# Patient Record
Sex: Male | Born: 1980 | Race: Black or African American | Hispanic: No | Marital: Single | Smoking: Former smoker
Health system: Southern US, Community
[De-identification: ages and names within clinical notes are randomized; demographics above are authoritative.]

## PROBLEM LIST (undated history)

## (undated) DIAGNOSIS — I10 Essential (primary) hypertension: Secondary | ICD-10-CM

## (undated) DIAGNOSIS — E669 Obesity, unspecified: Secondary | ICD-10-CM

## (undated) DIAGNOSIS — M109 Gout, unspecified: Secondary | ICD-10-CM

## (undated) HISTORY — DX: Essential (primary) hypertension: I10

## (undated) HISTORY — DX: Gout, unspecified: M10.9

---

## 2001-01-05 ENCOUNTER — Encounter: Payer: Self-pay | Admitting: Emergency Medicine

## 2001-01-05 ENCOUNTER — Emergency Department (HOSPITAL_COMMUNITY): Admission: EM | Admit: 2001-01-05 | Discharge: 2001-01-06 | Payer: Self-pay | Admitting: Emergency Medicine

## 2007-04-25 ENCOUNTER — Emergency Department (HOSPITAL_COMMUNITY): Admission: EM | Admit: 2007-04-25 | Discharge: 2007-04-25 | Payer: Self-pay | Admitting: Emergency Medicine

## 2013-12-30 ENCOUNTER — Encounter (HOSPITAL_COMMUNITY): Payer: Self-pay | Admitting: Emergency Medicine

## 2013-12-30 ENCOUNTER — Emergency Department (HOSPITAL_COMMUNITY)
Admission: EM | Admit: 2013-12-30 | Discharge: 2013-12-31 | Disposition: A | Discharge status: Home / Self Care | Payer: Self-pay | Attending: Emergency Medicine | Admitting: Emergency Medicine

## 2013-12-30 DIAGNOSIS — I10 Essential (primary) hypertension: Secondary | ICD-10-CM | POA: Insufficient documentation

## 2013-12-30 DIAGNOSIS — F17200 Nicotine dependence, unspecified, uncomplicated: Secondary | ICD-10-CM | POA: Insufficient documentation

## 2013-12-30 DIAGNOSIS — E669 Obesity, unspecified: Secondary | ICD-10-CM | POA: Insufficient documentation

## 2013-12-30 DIAGNOSIS — M7989 Other specified soft tissue disorders: Secondary | ICD-10-CM | POA: Insufficient documentation

## 2013-12-30 DIAGNOSIS — I5021 Acute systolic (congestive) heart failure: Secondary | ICD-10-CM | POA: Insufficient documentation

## 2013-12-30 HISTORY — DX: Obesity, unspecified: E66.9

## 2013-12-30 LAB — PRO B NATRIURETIC PEPTIDE: Pro B Natriuretic peptide (BNP): 2182 pg/mL — ABNORMAL HIGH (ref 0–125)

## 2013-12-30 LAB — COMPREHENSIVE METABOLIC PANEL
ALT: 24 U/L (ref 0–53)
AST: 33 U/L (ref 0–37)
Albumin: 3.6 g/dL (ref 3.5–5.2)
Alkaline Phosphatase: 62 U/L (ref 39–117)
Anion gap: 18 — ABNORMAL HIGH (ref 5–15)
BUN: 12 mg/dL (ref 6–23)
CO2: 20 mEq/L (ref 19–32)
Calcium: 8.9 mg/dL (ref 8.4–10.5)
Chloride: 99 mEq/L (ref 96–112)
Creatinine, Ser: 1.35 mg/dL (ref 0.50–1.35)
GFR calc Af Amer: 80 mL/min — ABNORMAL LOW (ref >90)
GFR calc non Af Amer: 69 mL/min — ABNORMAL LOW (ref >90)
Glucose, Bld: 94 mg/dL (ref 70–99)
Potassium: 4.3 mEq/L (ref 3.7–5.3)
Sodium: 137 mEq/L (ref 137–147)
Total Bilirubin: 0.8 mg/dL (ref 0.3–1.2)
Total Protein: 7 g/dL (ref 6.0–8.3)

## 2013-12-30 LAB — CBC
HCT: 38.5 % — ABNORMAL LOW (ref 39.0–52.0)
Hemoglobin: 13.6 g/dL (ref 13.0–17.0)
MCH: 29.2 pg (ref 26.0–34.0)
MCHC: 35.3 g/dL (ref 30.0–36.0)
MCV: 82.6 fL (ref 78.0–100.0)
Platelets: 292 10*3/uL (ref 150–400)
RBC: 4.66 MIL/uL (ref 4.22–5.81)
RDW: 14.9 % (ref 11.5–15.5)
WBC: 14.3 10*3/uL — ABNORMAL HIGH (ref 4.0–10.5)

## 2013-12-30 NOTE — ED Provider Notes (Signed)
CSN: [id number]     Arrival date & time 12/30/13  1921 History   First MD Initiated Contact with Patient 12/30/13 2148     Chief Complaint  Patient presents with  . Insect Bite  . Leg Swelling     (Consider location/radiation/quality/duration/timing/severity/associated sxs/prior Treatment) Patient is a 33 y.o. male presenting with general illness. The history is provided by the patient.  Illness Location:  Lower leg swelling Quality:  Swelling Severity:  Mild Onset quality:  Gradual Duration:  3 days Timing:  Constant Progression:  Worsening Chronicity:  New Context:  HTN, not on meds - multiple flea bites, thought it was swelling due to flights Relieved by:  Nothing Worsened by:  Nothing Associated symptoms: no abdominal pain, no cough, no fever, no shortness of breath and no vomiting     Past Medical History  Diagnosis Date  . Obese    History reviewed. No pertinent past surgical history. No family history on file. History  Substance Use Topics  . Smoking status: Current Every Day Smoker  . Smokeless tobacco: Not on file  . Alcohol Use: Yes    Review of Systems  Constitutional: Negative for fever.  Respiratory: Negative for cough and shortness of breath.   Gastrointestinal: Negative for vomiting and abdominal pain.  All other systems reviewed and are negative.     Allergies  Review of patient's allergies indicates no known allergies.  Home Medications   Prior to Admission medications   Not on File   BP 173/115  Pulse 107  Temp(Src) 98.5 F (36.9 C) (Oral)  Resp 18  Ht 6\' 1"  (1.854 m)  Wt 325 lb (147.419 kg)  BMI 42.89 kg/m2  SpO2 96% Physical Exam  Nursing note and vitals reviewed. Constitutional: He is oriented to person, place, and time. He appears well-developed and well-nourished. No distress.  HENT:  Head: Normocephalic and atraumatic.  Mouth/Throat: No oropharyngeal exudate.  Eyes: EOM are normal. Pupils are equal, round, and reactive to  light.  Neck: Normal range of motion. Neck supple.  Cardiovascular: Normal rate and regular rhythm.  Exam reveals no friction rub.   No murmur heard. Pulmonary/Chest: Effort normal and breath sounds normal. No respiratory distress. He has no wheezes. He has no rales.  Abdominal: He exhibits no distension. There is no tenderness. There is no rebound.  Musculoskeletal: Normal range of motion. He exhibits edema (1+ bilateral legs).  Neurological: He is alert and oriented to person, place, and time.  Skin: He is not diaphoretic.    ED Course  Procedures (including critical care time) Labs Review Labs Reviewed  PRO B NATRIURETIC PEPTIDE - Abnormal; Notable for the following:    Pro B Natriuretic peptide (BNP) 2182.0 (*)    All other components within normal limits  COMPREHENSIVE METABOLIC PANEL - Abnormal; Notable for the following:    GFR calc non Af Amer 69 (*)    GFR calc Af Amer 80 (*)    Anion gap 18 (*)    All other components within normal limits  CBC - Abnormal; Notable for the following:    WBC 14.3 (*)    HCT 38.5 (*)    All other components within normal limits    Imaging Review No results found.   EKG Interpretation None      MDM   Final diagnoses:  Acute systolic heart failure    44M here with leg swelling. Noted to be over past few days, thought to be from flea bites. Denies SOB,  CP, N/V/D. Here with HTN, mild tachycardia. No rales. Mild 1+ pitting edema in lower legs. Will check BNP and CMP.  BNP elevated at 2182. Will talk with Cards about medical optimization.  I spoke with the Cards fellow - will start on Lisinopril 10 mg and Lasix 20 mg daily. Message sent to [facility] and [facility] to help with f/u. Counseled patient on medication adherence, f/u with PCP and Cards, diet and exercise, HTN. Given return precautions for concern of angioedema with ACE. Stable for discharge.  Elwin Mocha, MD 12/31/13 [id number]

## 2013-12-30 NOTE — ED Notes (Signed)
Pt c/o bilateral feet swelling and itching.

## 2013-12-30 NOTE — ED Notes (Signed)
Pt. reports multiple flea bites to bilateral lower legs with swelling/itching . Pt. is hypertensive at triage - pt. is not taking antihypertensive medication .

## 2013-12-31 MED ORDER — LISINOPRIL 20 MG PO TABS
20.0000 mg | ORAL_TABLET | Freq: Once | ORAL | Status: AC
  Administered 2013-12-31: 20 mg via ORAL
  Filled 2013-12-31: qty 1

## 2013-12-31 MED ORDER — LISINOPRIL 20 MG PO TABS: 20.0000 mg | ORAL_TABLET | Freq: Every day | ORAL | Status: DC

## 2013-12-31 MED ORDER — FUROSEMIDE 20 MG PO TABS
20.0000 mg | ORAL_TABLET | Freq: Once | ORAL | Status: AC
  Administered 2013-12-31: 20 mg via ORAL
  Filled 2013-12-31: qty 1

## 2013-12-31 MED ORDER — LISINOPRIL 10 MG PO TABS: 10.0000 mg | ORAL_TABLET | Freq: Every day | ORAL | Status: DC

## 2013-12-31 MED ORDER — FUROSEMIDE 20 MG PO TABS: 20.0000 mg | ORAL_TABLET | Freq: Every day | ORAL | Status: DC

## 2013-12-31 MED ORDER — LISINOPRIL 10 MG PO TABS
10.0000 mg | ORAL_TABLET | Freq: Once | ORAL | Status: AC
  Administered 2013-12-31: 10 mg via ORAL
  Filled 2013-12-31: qty 1

## 2013-12-31 NOTE — Discharge Instructions (Signed)
Heart Failure °Heart failure is a condition in which the heart has trouble pumping blood. This means your heart does not pump blood efficiently for your body to work well. In some cases of heart failure, fluid may back up into your lungs or you may have swelling (edema) in your lower legs. Heart failure is usually a long-term (chronic) condition. It is important for you to take good care of yourself and follow your health care provider's treatment plan. °CAUSES  °Some health conditions can cause heart failure. Those health conditions include: °· High blood pressure (hypertension). Hypertension causes the heart muscle to work harder than normal. When pressure in the blood vessels is high, the heart needs to pump (contract) with more force in order to circulate blood throughout the body. High blood pressure eventually causes the heart to become stiff and weak. °· Coronary artery disease (CAD). CAD is the buildup of cholesterol and fat (plaque) in the arteries of the heart. The blockage in the arteries deprives the heart muscle of oxygen and blood. This can cause chest pain and may lead to a heart attack. High blood pressure can also contribute to CAD. °· Heart attack (myocardial infarction). A heart attack occurs when one or more arteries in the heart become blocked. The loss of oxygen damages the muscle tissue of the heart. When this happens, part of the heart muscle dies. The injured tissue does not contract as well and weakens the heart's ability to pump blood. °· Abnormal heart valves. When the heart valves do not open and close properly, it can cause heart failure. This makes the heart muscle pump harder to keep the blood flowing. °· Heart muscle disease (cardiomyopathy or myocarditis). Heart muscle disease is damage to the heart muscle from a variety of causes. These can include drug or alcohol abuse, infections, or unknown reasons. These can increase the risk of heart failure. °· Lung disease. Lung disease  makes the heart work harder because the lungs do not work properly. This can cause a strain on the heart, leading it to fail. °· Diabetes. Diabetes increases the risk of heart failure. High blood sugar contributes to high fat (lipid) levels in the blood. Diabetes can also cause slow damage to tiny blood vessels that carry important nutrients to the heart muscle. When the heart does not get enough oxygen and food, it can cause the heart to become weak and stiff. This leads to a heart that does not contract efficiently. °· Other conditions can contribute to heart failure. These include abnormal heart rhythms, thyroid problems, and low blood counts (anemia). °Certain unhealthy behaviors can increase the risk of heart failure, including: °· Being overweight. °· Smoking or chewing tobacco. °· Eating foods high in fat and cholesterol. °· Abusing illicit drugs or alcohol. °· Lacking physical activity. °SYMPTOMS  °Heart failure symptoms may vary and can be hard to detect. Symptoms may include: °· Shortness of breath with activity, such as climbing stairs. °· Persistent cough. °· Swelling of the feet, ankles, legs, or abdomen. °· Unexplained weight gain. °· Difficulty breathing when lying flat (orthopnea). °· Waking from sleep because of the need to sit up and get more air. °· Rapid heartbeat. °· Fatigue and loss of energy. °· Feeling light-headed, dizzy, or close to fainting. °· Loss of appetite. °· Nausea. °· Increased urination during the night (nocturia). °DIAGNOSIS  °A diagnosis of heart failure is based on your history, symptoms, physical examination, and diagnostic tests. Diagnostic tests for heart failure may include: °·   Echocardiography. °· Electrocardiography. °· Chest X-ray. °· Blood tests. °· Exercise stress test. °· Cardiac angiography. °· Radionuclide scans. °TREATMENT  °Treatment is aimed at managing the symptoms of heart failure. Medicines, behavioral changes, or surgical intervention may be necessary to  treat heart failure. °· Medicines to help treat heart failure may include: °¨ Angiotensin-converting enzyme (ACE) inhibitors. This type of medicine blocks the effects of a blood protein called angiotensin-converting enzyme. ACE inhibitors relax (dilate) the blood vessels and help lower blood pressure. °¨ Angiotensin receptor blockers (ARBs). This type of medicine blocks the actions of a blood protein called angiotensin. Angiotensin receptor blockers dilate the blood vessels and help lower blood pressure. °¨ Water pills (diuretics). Diuretics cause the kidneys to remove salt and water from the blood. The extra fluid is removed through urination. This loss of extra fluid lowers the volume of blood the heart pumps. °¨ Beta blockers. These prevent the heart from beating too fast and improve heart muscle strength. °¨ Digitalis. This increases the force of the heartbeat. °· Healthy behavior changes include: °¨ Obtaining and maintaining a healthy weight. °¨ Stopping smoking or chewing tobacco. °¨ Eating heart-healthy foods. °¨ Limiting or avoiding alcohol. °¨ Stopping illicit drug use. °¨ Physical activity as directed by your health care provider. °· Surgical treatment for heart failure may include: °¨ A procedure to open blocked arteries, repair damaged heart valves, or remove damaged heart muscle tissue. °¨ A pacemaker to improve heart muscle function and control certain abnormal heart rhythms. °¨ An internal cardioverter defibrillator to treat certain serious abnormal heart rhythms. °¨ A left ventricular assist device (LVAD) to assist the pumping ability of the heart. °HOME CARE INSTRUCTIONS  °· Take medicines only as directed by your health care provider. Medicines are important in reducing the workload of your heart, slowing the progression of heart failure, and improving your symptoms. °¨ Do not stop taking your medicine unless directed by your health care provider. °¨ Do not skip any dose of medicine. °¨ Refill your  prescriptions before you run out of medicine. Your medicines are needed every day. °· Engage in moderate physical activity if directed by your health care provider. Moderate physical activity can benefit some people. The elderly and people with severe heart failure should consult with a health care provider for physical activity recommendations. °· Eat heart-healthy foods. Food choices should be free of trans fat and low in saturated fat, cholesterol, and salt (sodium). Healthy choices include fresh or frozen fruits and vegetables, fish, lean meats, legumes, fat-free or low-fat dairy products, and whole grain or high fiber foods. Talk to a dietitian to learn more about heart-healthy foods. °· Limit sodium if directed by your health care provider. Sodium restriction may reduce symptoms of heart failure in some people. Talk to a dietitian to learn more about heart-healthy seasonings. °· Use healthy cooking methods. Healthy cooking methods include roasting, grilling, broiling, baking, poaching, steaming, or stir-frying. Talk to a dietitian to learn more about healthy cooking methods. °· Limit fluids if directed by your health care provider. Fluid restriction may reduce symptoms of heart failure in some people. °· Weigh yourself every day. Daily weights are important in the early recognition of excess fluid. You should weigh yourself every morning after you urinate and before you eat breakfast. Wear the same amount of clothing each time you weigh yourself. Record your daily weight. Provide your health care provider with your weight record. °· Monitor and record your blood pressure if directed by your health care   provider.  Check your pulse if directed by your health care provider.  Lose weight if directed by your health care provider. Weight loss may reduce symptoms of heart failure in some people.  Stop smoking or chewing tobacco. Nicotine makes your heart work harder by causing your blood vessels to constrict.  Do not use nicotine gum or patches before talking to your health care provider.  Keep all follow-up visits as directed by your health care provider. This is important.  Limit alcohol intake to no more than 1 drink per day for nonpregnant women and 2 drinks per day for men. One drink equals 12 ounces of beer, 5 ounces of wine, or 1 ounces of hard liquor. Drinking more than that is harmful to your heart. Tell your health care provider if you drink alcohol several times a week. Talk with your health care provider about whether alcohol is safe for you. If your heart has already been damaged by alcohol or you have severe heart failure, drinking alcohol should be stopped completely.  Stop illicit drug use.  Stay up-to-date with immunizations. It is especially important to prevent respiratory infections through current pneumococcal and influenza immunizations.  Manage other health conditions such as hypertension, diabetes, thyroid disease, or abnormal heart rhythms as directed by your health care provider.  Learn to manage stress.  Plan rest periods when fatigued.  Learn strategies to manage high temperatures. If the weather is extremely hot:  Avoid vigorous physical activity.  Use air conditioning or fans or seek a cooler location.  Avoid caffeine and alcohol.  Wear loose-fitting, lightweight, and light-colored clothing.  Learn strategies to manage cold temperatures. If the weather is extremely cold:  Avoid vigorous physical activity.  Layer clothes.  Wear mittens or gloves, a hat, and a scarf when going outside.  Avoid alcohol.  Obtain ongoing education and support as needed.  Participate in or seek rehabilitation as needed to maintain or improve independence and quality of life. SEEK MEDICAL CARE IF:   Your weight increases by 03 lb/1.4 kg in 1 day or 05 lb/2.3 kg in a week.  You have increasing shortness of breath that is unusual for you.  You are unable to participate in  your usual physical activities.  You tire easily.  You cough more than normal, especially with physical activity.  You have any or more swelling in areas such as your hands, feet, ankles, or abdomen.  You are unable to sleep because it is hard to breathe.  You feel like your heart is beating fast (palpitations).  You become dizzy or light-headed upon standing up. SEEK IMMEDIATE MEDICAL CARE IF:   You have difficulty breathing.  There is a change in mental status such as decreased alertness or difficulty with concentration.  You have a pain or discomfort in your chest.  You have an episode of fainting (syncope). MAKE SURE YOU:   Understand these instructions.  Will watch your condition.  Will get help right away if you are not doing well or get worse. Document Released: 04/09/2005 Document Revised: 08/24/2013 Document Reviewed: 05/09/2012 [facility] Patient Information 2015 [place], [place]. This information is not intended to replace advice given to you by your health care provider. Make sure you discuss any questions you have with your health care provider.   Emergency Department Resource Guide 1) Find a Doctor and Pay Out of Pocket Although you won't have to find out who is covered by your insurance plan, it is a good idea to ask around  and get recommendations. You will then need to call the office and see if the doctor you have chosen will accept you as a new patient and what types of options they offer for patients who are self-pay. Some doctors offer discounts or will set up payment plans for their patients who do not have insurance, but you will need to ask so you aren't surprised when you get to your appointment.  2) Contact Your Local Health Department Not all health departments have doctors that can see patients for sick visits, but many do, so it is worth a call to see if yours does. If you don't know where your local health department is, you can check in your phone  book. The CDC also has a tool to help you locate your state's health department, and many state websites also have listings of all of their local health departments.  3) Find a Walk-in Clinic If your illness is not likely to be very severe or complicated, you may want to try a walk in clinic. These are popping up all over the country in pharmacies, drugstores, and shopping centers. They're usually staffed by nurse practitioners or physician assistants that have been trained to treat common illnesses and complaints. They're usually fairly quick and inexpensive. However, if you have serious medical issues or chronic medical problems, these are probably not your best option.  No Primary Care Doctor: - Call Health Connect at  [phone] - they can help you locate a primary care doctor that  accepts your insurance, provides certain services, etc. - Physician Referral Service- [phone]  Chronic Pain Problems: Organization         Address  Phone   Notes  [facility]  [phone] Patients need to be referred by their primary care doctor.   Medication Assistance: Organization         Address  Phone   Notes  [facility] Medication [facility] [address] [place] [place], [place] [address] [phone] --Must be a resident of [facility] -- Must have NO insurance coverage whatsoever (no Medicaid/ Medicare, etc.) -- The pt. MUST have a primary care doctor that directs their care regularly and follows them in the community   [facility]  [phone]   [employer]  [phone]    Agencies that provide inexpensive medical care: Organization         Address  Phone   Notes  [facility]  [phone]   [facility]    [phone]   [facility] [address] [place], [place] [address] [phone]   [facility] [address], [place] [phone]   Planned  Parenthood    [phone]   [facility]    [phone]   Community Health and [facility]  [address], [place] Phone:  [phone], Fax:  [phone] Hours of Operation:  9 am - 6 pm, M-F.  Also accepts Medicaid/Medicare and self-pay.  [facility]  [address],  Phone: [phone], Fax: [phone]. Hours of Operation:  8:30 am - 5:30 pm, M-F.  Also accepts Medicaid and self-pay.  [facility] [place] [address] [place] Phone: [phone]   [facility] [address] Natasha Bence [place], [place] [phone], Ext. [phone] Mondays & Thursdays: 7-9 AM.  First 15 patients are seen on  a first come, first serve basis.    Medicaid-accepting [facility] Providers:  Organization         Address  Phone   Notes  [facility] [address], [place] [phone] Also accepts self-pay patients.  [facility] [address] Laurell Josephs [place], [place]  [phone]   [facility] [address], [place] [phone]   [facility] [address] [place] [phone]   Renaye Rakers [address], [place]   [phone] Only accepts [place] Access [place] patients after they have their name applied to their card.   Self-Pay (no insurance) in [facility]:  Organization         Address  Phone   Notes  Sickle Cell Patients, [facility] Internal Medicine [address] [place], [place] [phone]   [facility] Urgent Care [address] [place], [place] [phone]   [facility] [place]  [address], [place] [phone]   [facility]/Dr. Osei-Bonsu  [address] [place] or [address], [place] [phone] Phone number for both [place] and [place] locations is the same.    Urgent Medical and [facility] [address], [place] [phone]   [facility] [address], [place] or [address] [phone]   [facility] [address] [place] [phone], phone; [phone], fax Sees patients 1st and 3rd Saturday of every month.  Must not qualify for public or private insurance (i.e. Medicaid, Medicare, [place] Health Choice, Veterans' Benefits)  Household income should be no more than 200% of the poverty level The clinic cannot treat you if you are pregnant or think you are pregnant  Sexually transmitted diseases are not treated at the clinic.    Dental Care: Organization         Address  Phone  Notes  [facility] of [facility] [address] [place], [place] [phone] Accepts children up to age 54 who are enrolled in [place] or [place] Health Choice; pregnant women with a Medicaid card; and children who have applied for Medicaid or [place] Health Choice, but were declined, whose parents can pay a reduced fee at time of service.  [facility] Department of [facility]  [address], [place] [phone] Accepts children up to age 59 who are enrolled in [place] or [place] Health Choice; pregnant women with a Medicaid card; and children who have applied for Medicaid or [place] Health Choice, but were declined, whose parents can pay a reduced fee at time of service.  [facility]  [address] [place], [place] [phone] Patients are seen by appointment only. Walk-ins are not accepted. [facility] will see patients 60 years of age and older. Monday - Tuesday (8am-5pm) Most Wednesdays (8:30-5pm) $30 per visit, cash only  [facility] Adult Dental Access PROGRAM  [address], [facility] [phone] Patients are seen by appointment only. Walk-ins are not accepted. [facility] will see patients 9  years of age and older. One Wednesday Evening (Monthly: Volunteer Based).  $30 per visit, cash only  [employer]  [phone] for adults; Children under age 66, call Graduate Pediatric Dentistry at [phone].  Children aged 1-14, please call [phone] to request a pediatric application.  Dental services are provided in all areas of dental care including fillings, crowns and bridges, complete and partial dentures, implants, gum treatment, root canals, and extractions. Preventive care is also provided. Treatment is provided to both adults and children. Patients are selected via a lottery and there is often a waiting list.   [facility] [address] [place]  [phone] [email or web address]   [facility] [address] [place], [place] [phone], Ext. [phone] Second and Fourth Thursday of each month, opens at 6:30 AM; Clinic ends at 9 AM.  Patients are seen on a first-come first-served basis, and a limited number are seen during each clinic.   [facility]  [address] Ether Griffins [place], [place] [phone]   Eligibility Requirements You must have lived in [place], [place], or [place] counties for at least the last three months.   You cannot be eligible for state or federal sponsored [place], including [employer], [place], or [employer].   You generally cannot be eligible for healthcare insurance through your employer.    How to apply: Eligibility screenings are held every Tuesday and Wednesday afternoon from 1:00 pm until 4:00 pm. You do not need an appointment for the interview!  [facility] [address] [place], [place] [phone]   [facility] Health Department  [phone]   [facility] Health Department  [phone]   [facility] Health Department  [phone]    Behavioral Health Resources in the Community: Intensive Outpatient  Programs Organization         Address  Phone  Notes  [facility] Services [address], [place], [place] [phone]   [facility] Outpatient [address], [place], [place] [phone]   [facility] [address] [place], [place]  [phone]   [facility] Mental Health [address]  [place], [place] [phone] or [phone]   Substance Abuse Resources Organization         Address  Phone  Notes  [facility]  [phone]   [facility]  [phone]   [facility] [place]  [phone]   Floydene Flock  [phone]   Residential & Outpatient Substance Abuse Program  [phone]   Psychological Services Organization         Address  Phone  Notes  [facility] Behavior[facility] Health  [phone]   [facility] Services  [phone]   [facility] [address] [place] [phone] or [phone]    Mobile Crisis Teams Organization         Address  Phone  Notes  [facility], Mobile Crisis Care Unit  [phone]   [facility]  [address] [place], [place] [phone]   Doristine Locks [address] [place] [phone]    Self-Help/Support Groups Organization         Address  Phone             Notes  [facility] [place] - variety of support groups  336- I7437963 Call for more information  Narcotics Anonymous (NA), [facility] [address], [employer] [place]  2 meetings at this location   Statistician         Address  Phone  Notes  [facility] [address],    [place]  [phone]  [facility]  [address] [place] [address], [place], [place] [phone]   [facility] Treatment Facility [address] [place], [place] [phone] Admissions: 8am-3pm M-F  [facility] [address],    [place], [place]  [phone]   [facility] [address] [place], [place], [place] [phone]   [facility] [address],  [place], [place] [phone]   [facility] - Intensive Outpatient [address] Laurell Josephs [address], [place], [place] [phone]   [facility] ([facility] [address]  [place], [place] [phone] or [phone]   [facility] ([facility]) [address], [place], [place] [phone] Accepts Medicaid  [facility] [place] [address]  [place] [phone] Substance Abuse/Addiction Treatment   [facility] Organization         Address  Phone  Notes  [facility]  [phone]   Angie Fava, PhD [address] Ervin Knack [place], [place]   [phone] or [phone]   [facility] Behavioral   [address] [place] [employer], [place] [phone]   [facility] [address] [place], [place] [phone] Insurance/Medicaid/sponsorship through [facility] and Families [address]                                    [place], [place] [phone] Therapy/tele-psych/case  [facility] [address], [place] [phone]    Dr. Lolly Mustache  [phone]   [facility]  [employer] [facility] Dept. 1) [address], [place] 2) [address], [place] 3)  [address], [place] [phone]  [phone]   [facility] Child Abuse Hotline 616 758 2719 or [phone] (After Hours)

## 2014-01-04 ENCOUNTER — Telehealth: Payer: Self-pay | Admitting: Internal Medicine

## 2014-01-08 NOTE — Telephone Encounter (Signed)
Closed encounter °

## 2014-01-22 ENCOUNTER — Ambulatory Visit: Payer: Self-pay | Admitting: Internal Medicine

## 2014-01-22 DIAGNOSIS — Z0289 Encounter for other administrative examinations: Secondary | ICD-10-CM

## 2014-01-28 ENCOUNTER — Encounter: Payer: Self-pay | Admitting: Internal Medicine

## 2014-01-28 ENCOUNTER — Ambulatory Visit (INDEPENDENT_AMBULATORY_CARE_PROVIDER_SITE_OTHER): Payer: Self-pay | Admitting: Internal Medicine

## 2014-01-28 VITALS — BP 181/109 | HR 104 | Ht 73.0 in | Wt 371.1 lb

## 2014-01-28 DIAGNOSIS — M1000 Idiopathic gout, unspecified site: Secondary | ICD-10-CM

## 2014-01-28 DIAGNOSIS — Z79899 Other long term (current) drug therapy: Secondary | ICD-10-CM

## 2014-01-28 DIAGNOSIS — R7989 Other specified abnormal findings of blood chemistry: Secondary | ICD-10-CM

## 2014-01-28 DIAGNOSIS — R600 Localized edema: Secondary | ICD-10-CM

## 2014-01-28 DIAGNOSIS — R0609 Other forms of dyspnea: Secondary | ICD-10-CM | POA: Insufficient documentation

## 2014-01-28 DIAGNOSIS — I10 Essential (primary) hypertension: Secondary | ICD-10-CM

## 2014-01-28 DIAGNOSIS — M109 Gout, unspecified: Secondary | ICD-10-CM | POA: Insufficient documentation

## 2014-01-28 DIAGNOSIS — R799 Abnormal finding of blood chemistry, unspecified: Secondary | ICD-10-CM

## 2014-01-28 DIAGNOSIS — Z13220 Encounter for screening for lipoid disorders: Secondary | ICD-10-CM

## 2014-01-28 DIAGNOSIS — R0600 Dyspnea, unspecified: Secondary | ICD-10-CM

## 2014-01-28 DIAGNOSIS — R0602 Shortness of breath: Secondary | ICD-10-CM

## 2014-01-28 MED ORDER — IRBESARTAN-HYDROCHLOROTHIAZIDE 300-12.5 MG PO TABS: 1.0000 | ORAL_TABLET | Freq: Every day | ORAL | Status: DC

## 2014-01-28 MED ORDER — FUROSEMIDE 20 MG PO TABS: 20.0000 mg | ORAL_TABLET | Freq: Two times a day (BID) | ORAL | Status: DC

## 2014-01-28 NOTE — Progress Notes (Signed)
OFFICE NOTE  Chief Complaint:  Shortness of breath  Primary Care Physician: No PCP Per Patient  HPI:  Antonio Morgan is a pleasant 33 year old morbidly obese male who was recently [facility] for shortness of breath and lower extremity swelling. He presented initially because of concerns about fleabites and was noted to be significantly swollen with an elevated BNP over 2000. His case was discussed with the cardiology fellow who recommended starting lisinopril and Lasix. Blood pressure was notably elevated. He reports he's had high blood pressure for a long time but has not had health insurance or gotten any health care or seen a physician. He's recently had worsening shortness of breath. With his Lasix he noted some improvement in the swelling however still remains swollen. His blood pressure is very poorly controlled today. He is tachycardic at rest. He denies any chest pain. He does report good sleep at night since he stopped smoking about 3 months ago. He's also been working on reducing sugar intake, soft drinks and trying to get some more regular exercise for weight loss. His family history significant for father who had a cardiomyopathy of unknown etiology and underwent a heart transplant but ultimately died. He is not sure the details of this.  PMHx:  Past Medical History  Diagnosis Date  . Obese   . Hypertension   . Gout     History reviewed. No pertinent past surgical history.  FAMHx:  Family History  Problem Relation Age of Onset  . Heart disease Father     heart transplant  . Hypertension Mother     SOCHx:   reports that he quit smoking about 3 months ago. He has never used smokeless tobacco. He reports that he does not drink alcohol or use illicit drugs.  ALLERGIES:  Allergies  Allergen Reactions  . Lisinopril Cough    ROS: A comprehensive review of systems was negative except for: Respiratory: positive for dyspnea on  exertion Cardiovascular: positive for lower extremity edema and tachycardia  HOME MEDS: Current Outpatient Prescriptions  Medication Sig Dispense Refill  . colchicine 0.6 MG tablet Take 0.6 mg by mouth daily as needed.      . furosemide (LASIX) 20 MG tablet Take 1 tablet (20 mg total) by mouth 2 (two) times daily.  60 tablet  6  . IBUPROFEN PO Take by mouth as needed.      . irbesartan-hydrochlorothiazide (AVALIDE) 300-12.5 MG per tablet Take 1 tablet by mouth daily.  30 tablet  6   No current facility-administered medications for this visit.    LABS/IMAGING: No results found for this or any previous visit (from the past 48 hour(s)). No results found.  VITALS: BP 181/109  Pulse 104  Ht 6\' 1"  (1.854 m)  Wt 371 lb 1.6 oz (168.33 kg)  BMI 48.97 kg/m2  EXAM: General appearance: alert and no distress Neck: no JVD and supple, symmetrical, trachea midline Lungs: clear to auscultation bilaterally Heart: regular tachycardia, s1/s2, no S3, no murmur Abdomen: morbidly obese, protuberant, +BS Extremities: edema 2+ firm bilateral leg edema to knees Pulses: 2+ and symmetric Skin: dry, warm Neurologic: Alert and oriented X 3, normal strength and tone. Normal symmetric reflexes. Normal coordination and gait Psych: Pleasant, normal mood  EKG: Sinus tachycardia at 104, left atrial enlargement, nonspecific T wave changes  ASSESSMENT: 1. Dyspnea and exertion with elevated BNP 2. Lower extremity edema 3. Malignant hypertension 4. Morbid obesity  PLAN: 1.   Mr. Duggins has  significant blood pressure elevation with elevated BNP and lower extremity edema. This is improved somewhat with Lasix. I am concerned that he may have a cardiomyopathy, possibly related to hypertension. There is also a family history of cardiomyopathy and his father who underwent a heart transplant for unknown etiology. I would recommend additional diuresis with adding a second dose of Lasix in the afternoon. He has  reported some cough with lisinopril. I would discontinue this and switch him to irbesartan/HCTZ 300/12.5 mg daily. He will need repeat blood work early next week including a CMP, lipid panel, and BNP. I would also recommend an echocardiogram to evaluate for cardiomyopathy. Unfortunately does not have insurance and will need to get this in place. Plan to see him back in a few weeks to further titrate his blood pressure medications and review his studies.  Chrystie NoseKenneth C. Hilty, MD, [facility] Center[facility]ACC Attending Cardiologist [facility]  HILTY,Kenneth C 01/28/2014, 8:29 AM

## 2014-01-28 NOTE — Patient Instructions (Signed)
Your physician has requested that you have an echocardiogram. Echocardiography is a painless test that uses sound waves to create images of your heart. It provides your doctor with information about the size and shape of your heart and how well your heart's chambers and valves are working. This procedure takes approximately one hour. There are no restrictions for this procedure.  Your physician has recommended you make the following change in your medication...  1. STOP lisinpril  2. START irbesartan-hctz 300-12.5mg  in morning for blood pressure 3. INCREASE furosemide (lasix) to 20mg  twice daily   Please have lab work on Monday Oct. 12th or Tuesday Oct. 13th  Your physician recommends that you schedule a follow-up appointment in 2-3 weeks with Dr. Rennis Golden (OK to double book)

## 2014-02-09 ENCOUNTER — Telehealth (HOSPITAL_COMMUNITY): Payer: Self-pay | Admitting: *Deleted

## 2014-02-19 ENCOUNTER — Ambulatory Visit: Payer: Self-pay

## 2014-02-23 ENCOUNTER — Ambulatory Visit (HOSPITAL_COMMUNITY): Admission: RE | Admit: 2014-02-23 | Payer: Self-pay | Source: Ambulatory Visit

## 2014-02-24 ENCOUNTER — Ambulatory Visit: Payer: Self-pay | Admitting: Internal Medicine

## 2014-03-04 ENCOUNTER — Ambulatory Visit: Payer: Self-pay

## 2014-11-27 ENCOUNTER — Other Ambulatory Visit: Payer: Self-pay | Admitting: Internal Medicine

## 2014-11-29 NOTE — Telephone Encounter (Signed)
Rx(s) sent to pharmacy electronically.  

## 2014-11-30 ENCOUNTER — Other Ambulatory Visit: Payer: Self-pay | Admitting: Internal Medicine

## 2014-11-30 ENCOUNTER — Telehealth: Payer: Self-pay | Admitting: Internal Medicine

## 2014-11-30 NOTE — Telephone Encounter (Signed)
Rx(s) sent to pharmacy electronically.  

## 2014-11-30 NOTE — Telephone Encounter (Signed)
Needs refill of Irvastatin/HCTAZ and Lasix.  Called [facility] has 2 refills for Avalide and 3 refills for Laisx.  Reminded patient he needs to make an Appointment for November.  He said he is trying to get insurance straightened out

## 2014-11-30 NOTE — Telephone Encounter (Signed)
° °  1. Which medications need to be refilled? Irvastatin/ HCTZ and furosemide   2. Which pharmacy is medication to be sent to? [facility]   3. Do they need a 30 day or 90 day supply?90  4. Would they like a call back once the medication has been sent to the pharmacy? yes

## 2015-03-16 ENCOUNTER — Other Ambulatory Visit: Payer: Self-pay | Admitting: Internal Medicine

## 2015-03-16 MED ORDER — IRBESARTAN-HYDROCHLOROTHIAZIDE 300-12.5 MG PO TABS: 1.0000 | ORAL_TABLET | Freq: Every day | ORAL | Status: DC

## 2015-03-16 MED ORDER — FUROSEMIDE 20 MG PO TABS: 20.0000 mg | ORAL_TABLET | Freq: Two times a day (BID) | ORAL | Status: DC

## 2015-03-16 NOTE — Telephone Encounter (Signed)
°*  STAT* If patient is at the pharmacy, call can be transferred to refill team.   1. Which medications need to be refilled? (please list name of each medication and dose if known)Furosemide and Irbesartan  2. Which pharmacy/location (including street and city if local pharmacy) is medication to be sent to?[phone]  3. Do they need a 30 day or 90 day supply? 90 and refills

## 2015-03-16 NOTE — Telephone Encounter (Signed)
Left message for pt to call.

## 2015-03-16 NOTE — Telephone Encounter (Signed)
Pt's Rx was sent to pt's pharmacy as requested. Informing pt to schedule an yearly appointment before anymore refills. 2nd attempt. Confirmation received.

## 2015-03-16 NOTE — Telephone Encounter (Signed)
Pt called in stating that 15 day supplies were called in to the pharmacy, but he spoke with a nurse and was told that his 2 medications(Furosemide and Irbesartan) were written for 90 days. I informed him that he would not get a 90 day supply called in until he came in to see the doctor for an appt. He says he does not have any insurance and can not afford the office visit. I I informed him that someone would call and further explain this situation.

## 2015-03-22 NOTE — Telephone Encounter (Signed)
Returned call to patient no answer.LMTC. 

## 2015-09-05 ENCOUNTER — Other Ambulatory Visit: Payer: Self-pay | Admitting: Internal Medicine

## 2015-09-05 NOTE — Telephone Encounter (Signed)
Rx request sent to pharmacy.  

## 2015-10-12 ENCOUNTER — Other Ambulatory Visit: Payer: Self-pay | Admitting: Internal Medicine

## 2015-10-12 ENCOUNTER — Other Ambulatory Visit: Payer: Self-pay | Admitting: *Deleted

## 2015-10-12 MED ORDER — IRBESARTAN-HYDROCHLOROTHIAZIDE 300-12.5 MG PO TABS: 1.0000 | ORAL_TABLET | Freq: Every day | ORAL | Status: DC

## 2015-11-14 ENCOUNTER — Other Ambulatory Visit: Payer: Self-pay | Admitting: Internal Medicine

## 2015-12-08 ENCOUNTER — Emergency Department (HOSPITAL_COMMUNITY): Payer: BC Managed Care – PPO

## 2015-12-08 ENCOUNTER — Encounter (HOSPITAL_COMMUNITY): Payer: Self-pay | Admitting: Emergency Medicine

## 2015-12-08 ENCOUNTER — Inpatient Hospital Stay (HOSPITAL_COMMUNITY)
Admission: EM | Admit: 2015-12-08 | Discharge: 2015-12-14 | DRG: 286 | Disposition: A | Discharge status: Home / Self Care | Payer: BC Managed Care – PPO | Attending: Internal Medicine | Admitting: Internal Medicine

## 2015-12-08 DIAGNOSIS — Z8249 Family history of ischemic heart disease and other diseases of the circulatory system: Secondary | ICD-10-CM

## 2015-12-08 DIAGNOSIS — R601 Generalized edema: Secondary | ICD-10-CM

## 2015-12-08 DIAGNOSIS — I428 Other cardiomyopathies: Secondary | ICD-10-CM | POA: Diagnosis present

## 2015-12-08 DIAGNOSIS — R0602 Shortness of breath: Secondary | ICD-10-CM | POA: Diagnosis not present

## 2015-12-08 DIAGNOSIS — J189 Pneumonia, unspecified organism: Secondary | ICD-10-CM

## 2015-12-08 DIAGNOSIS — I5041 Acute combined systolic (congestive) and diastolic (congestive) heart failure: Secondary | ICD-10-CM | POA: Diagnosis present

## 2015-12-08 DIAGNOSIS — I130 Hypertensive heart and chronic kidney disease with heart failure and stage 1 through stage 4 chronic kidney disease, or unspecified chronic kidney disease: Principal | ICD-10-CM | POA: Diagnosis present

## 2015-12-08 DIAGNOSIS — Z6841 Body Mass Index (BMI) 40.0 and over, adult: Secondary | ICD-10-CM

## 2015-12-08 DIAGNOSIS — N182 Chronic kidney disease, stage 2 (mild): Secondary | ICD-10-CM | POA: Diagnosis present

## 2015-12-08 DIAGNOSIS — J40 Bronchitis, not specified as acute or chronic: Secondary | ICD-10-CM | POA: Diagnosis present

## 2015-12-08 DIAGNOSIS — R000 Tachycardia, unspecified: Secondary | ICD-10-CM | POA: Diagnosis present

## 2015-12-08 DIAGNOSIS — Z87891 Personal history of nicotine dependence: Secondary | ICD-10-CM

## 2015-12-08 DIAGNOSIS — R0902 Hypoxemia: Secondary | ICD-10-CM

## 2015-12-08 DIAGNOSIS — R0600 Dyspnea, unspecified: Secondary | ICD-10-CM

## 2015-12-08 DIAGNOSIS — I248 Other forms of acute ischemic heart disease: Secondary | ICD-10-CM | POA: Diagnosis present

## 2015-12-08 DIAGNOSIS — R0609 Other forms of dyspnea: Secondary | ICD-10-CM

## 2015-12-08 DIAGNOSIS — I509 Heart failure, unspecified: Secondary | ICD-10-CM

## 2015-12-08 DIAGNOSIS — R7989 Other specified abnormal findings of blood chemistry: Secondary | ICD-10-CM

## 2015-12-08 DIAGNOSIS — I10 Essential (primary) hypertension: Secondary | ICD-10-CM | POA: Diagnosis present

## 2015-12-08 DIAGNOSIS — G4733 Obstructive sleep apnea (adult) (pediatric): Secondary | ICD-10-CM | POA: Diagnosis present

## 2015-12-08 DIAGNOSIS — J9691 Respiratory failure, unspecified with hypoxia: Secondary | ICD-10-CM | POA: Diagnosis not present

## 2015-12-08 DIAGNOSIS — R609 Edema, unspecified: Secondary | ICD-10-CM

## 2015-12-08 DIAGNOSIS — Z79899 Other long term (current) drug therapy: Secondary | ICD-10-CM

## 2015-12-08 LAB — CBC WITH DIFFERENTIAL/PLATELET
Basophils Absolute: 0 10*3/uL (ref 0.0–0.1)
Basophils Relative: 0 %
EOS ABS: 0 10*3/uL (ref 0.0–0.7)
Eosinophils Relative: 0 %
HCT: 40.9 % (ref 39.0–52.0)
HEMOGLOBIN: 13.3 g/dL (ref 13.0–17.0)
LYMPHS ABS: 1.1 10*3/uL (ref 0.7–4.0)
Lymphocytes Relative: 8 %
MCH: 25 pg — ABNORMAL LOW (ref 26.0–34.0)
MCHC: 32.5 g/dL (ref 30.0–36.0)
MCV: 77 fL — ABNORMAL LOW (ref 78.0–100.0)
Monocytes Absolute: 1 10*3/uL (ref 0.1–1.0)
Monocytes Relative: 8 %
NEUTROS ABS: 10.7 10*3/uL — AB (ref 1.7–7.7)
NEUTROS PCT: 84 %
Platelets: 339 10*3/uL (ref 150–400)
RBC: 5.31 MIL/uL (ref 4.22–5.81)
RDW: 16.3 % — ABNORMAL HIGH (ref 11.5–15.5)
WBC: 12.8 10*3/uL — AB (ref 4.0–10.5)

## 2015-12-08 LAB — I-STAT CG4 LACTIC ACID, ED: Lactic Acid, Venous: 2.12 mmol/L (ref 0.5–1.9)

## 2015-12-08 LAB — BASIC METABOLIC PANEL
Anion gap: 9 (ref 5–15)
BUN: 16 mg/dL (ref 6–20)
CO2: 28 mmol/L (ref 22–32)
Calcium: 8.8 mg/dL — ABNORMAL LOW (ref 8.9–10.3)
Chloride: 100 mmol/L — ABNORMAL LOW (ref 101–111)
Creatinine, Ser: 1.45 mg/dL — ABNORMAL HIGH (ref 0.61–1.24)
GFR calc Af Amer: >60 mL/min (ref >60)
GFR calc non Af Amer: >60 mL/min (ref >60)
Glucose, Bld: 108 mg/dL — ABNORMAL HIGH (ref 65–99)
POTASSIUM: 4 mmol/L (ref 3.5–5.1)
SODIUM: 137 mmol/L (ref 135–145)

## 2015-12-08 LAB — URINALYSIS, ROUTINE W REFLEX MICROSCOPIC
Bilirubin Urine: NEGATIVE
GLUCOSE, UA: NEGATIVE mg/dL
Hgb urine dipstick: NEGATIVE
Ketones, ur: NEGATIVE mg/dL
LEUKOCYTES UA: NEGATIVE
NITRITE: NEGATIVE
PH: 7.5 (ref 5.0–8.0)
PROTEIN: NEGATIVE mg/dL
Specific Gravity, Urine: 1.013 (ref 1.005–1.030)

## 2015-12-08 LAB — I-STAT TROPONIN, ED: TROPONIN I, POC: 0.07 ng/mL (ref 0.00–0.08)

## 2015-12-08 LAB — MAGNESIUM: Magnesium: 1.5 mg/dL — ABNORMAL LOW (ref 1.7–2.4)

## 2015-12-08 LAB — BRAIN NATRIURETIC PEPTIDE: B NATRIURETIC PEPTIDE 5: 728.7 pg/mL — AB (ref 0.0–100.0)

## 2015-12-08 LAB — PHOSPHORUS: PHOSPHORUS: 3 mg/dL (ref 2.5–4.6)

## 2015-12-08 MED ORDER — IPRATROPIUM BROMIDE 0.02 % IN SOLN
0.5000 mg | Freq: Once | RESPIRATORY_TRACT | Status: AC
  Administered 2015-12-08: 0.5 mg via RESPIRATORY_TRACT
  Filled 2015-12-08: qty 2.5

## 2015-12-08 MED ORDER — ALBUTEROL SULFATE (2.5 MG/3ML) 0.083% IN NEBU
5.0000 mg | INHALATION_SOLUTION | Freq: Once | RESPIRATORY_TRACT | Status: AC
  Administered 2015-12-08: 5 mg via RESPIRATORY_TRACT
  Filled 2015-12-08: qty 6

## 2015-12-08 MED ORDER — ACETAMINOPHEN 500 MG PO TABS
1000.0000 mg | ORAL_TABLET | Freq: Once | ORAL | Status: AC
  Administered 2015-12-08: 1000 mg via ORAL
  Filled 2015-12-08: qty 2

## 2015-12-08 MED ORDER — IOPAMIDOL (ISOVUE-370) INJECTION 76%
100.0000 mL | Freq: Once | INTRAVENOUS | Status: AC | PRN
  Administered 2015-12-08: 100 mL via INTRAVENOUS

## 2015-12-08 NOTE — ED Provider Notes (Signed)
[facility]-EMERGENCY DEPT Provider Note   CSN: [id number] Arrival date & time: 12/08/15  1951  By signing my name below, I, Antonio Morgan, attest that this documentation has been prepared under the direction and in the presence of TXU CorpHannah Satoshi Kalas, PA-C. Electronically Signed: Linna Darnerussell Morgan, Scribe. 12/08/2015. 8:56 PM.  History   Chief Complaint Chief Complaint  Patient presents with  . Fever  . Shortness of Breath    The history is provided by the patient and medical records. No language interpreter was used.     HPI Comments: Antonio Morgan is a 35 y.o. male with PMHx of gout, HTN, DOE, and obesity who presents to the Emergency Department complaining of constant, worsening, SOB for the last several months. Pt states he has not felt normal since beginning an HCTZ prescription a couple of years ago. He reports SOB exacerbation with any type of exertion as well as lower extremity swelling, abdominal distension, cough, decreased appetite, and general malaise. Pt believes his SOB is a result of fluid buildup; he reports worsening fluid buildup in his legs over the last several months that has radiated into his abdomen. He believes his HCTZ medication is contributing to his fluid buildup. Pt states he began vomiting today after being in the heat for a couple of hours. He reports that he also takes Furosemide and a colchicine pill. He denies alcohol use, smoking, or other drug use. Pt denies CP or any other associated symptoms.  Past Medical History:  Diagnosis Date  . Gout   . Hypertension   . Obese     Patient Active Problem List   Diagnosis Date Noted  . Acute congestive heart failure (HCC) 12/09/2015  . CHF (congestive heart failure) (HCC) 12/09/2015  . DOE (dyspnea on exertion) 01/28/2014  . Morbid obesity (HCC) 01/28/2014  . Essential hypertension, malignant 01/28/2014  . Elevated brain natriuretic peptide (BNP) level 01/28/2014  . Gout 01/28/2014    History reviewed. No  pertinent surgical history.   Home Medications    Prior to Admission medications   Medication Sig Start Date End Date Taking? Authorizing Provider  colchicine 0.6 MG tablet Take 0.6 mg by mouth daily as needed (gout).    Yes Historical Provider, MD  furosemide (LASIX) 20 MG tablet Take 1 tablet (20 mg total) by mouth 2 (two) times daily. 03/16/15  Yes Chrystie NoseKenneth C Hilty, MD  IBUPROFEN PO Take 400 mg by mouth as needed (pain).    Yes Historical Provider, MD  irbesartan-hydrochlorothiazide (AVALIDE) 300-12.5 MG tablet TAKE 1 TABLET DAILY 11/14/15  Yes Chrystie NoseKenneth C Hilty, MD    Family History Family History  Problem Relation Age of Onset  . Heart disease Father     heart transplant  . Hypertension Mother     Social History Social History  Substance Use Topics  . Smoking status: Former Smoker    Years: 10.00    Quit date: 10/28/2013  . Smokeless tobacco: Never Used  . Alcohol use No     Allergies   Lisinopril   Review of Systems Review of Systems  Constitutional: Positive for appetite change (decreased).  Respiratory: Positive for shortness of breath.   Cardiovascular: Negative for chest pain.  Gastrointestinal: Positive for abdominal distention and vomiting.  Musculoskeletal: Positive for joint swelling (lower extremities, fluid buildup).  All other systems reviewed and are negative.   Physical Exam Updated Vital Signs BP 138/88 (BP Location: Right Arm)   Pulse 106   Temp 100.4 F (38 C) (Oral)  Resp 18   Ht 6' 1.5" (1.867 m)   Wt (!) 158.8 kg   SpO2 98%   BMI 45.55 kg/m   Physical Exam  Constitutional: He appears well-developed and well-nourished. He appears distressed.  Awake, alert, increased work of breathing, uncomfortable appearing  HENT:  Head: Normocephalic and atraumatic.  Mouth/Throat: Oropharynx is clear and moist. No oropharyngeal exudate.  Eyes: Conjunctivae are normal. No scleral icterus.  Neck: Normal range of motion. Neck supple.    Cardiovascular: Regular rhythm and intact distal pulses.  Tachycardia present.   Pulses:      Radial pulses are 2+ on the right side, and 2+ on the left side.  Pulmonary/Chest: Effort normal. No respiratory distress. He has decreased breath sounds in the right middle field, the right lower field, the left middle field and the left lower field. He has wheezes in the right upper field and the left upper field.  Equal chest expansion Patient becomes dyspneic and hypoxic with any movement  Abdominal: Soft. Bowel sounds are normal. He exhibits distension. He exhibits no mass. There is no tenderness. There is no rebound and no guarding.  Anasarca of the lower abdomen  Musculoskeletal: Normal range of motion. He exhibits no edema.  Non-pitting edema of the entirety of the bilateral lower extremities, venous stasis changes of the lower legs  Neurological: He is alert.  Speech is clear and goal oriented Moves extremities without ataxia  Skin: Skin is warm and dry. He is not diaphoretic.  Psychiatric: He has a normal mood and affect.  Nursing note and vitals reviewed.   ED Treatments / Results  Labs (all labs ordered are listed, but only abnormal results are displayed) Labs Reviewed  CBC WITH DIFFERENTIAL/PLATELET - Abnormal; Notable for the following:       Result Value   WBC 12.8 (*)    MCV 77.0 (*)    MCH 25.0 (*)    RDW 16.3 (*)    Neutro Abs 10.7 (*)    All other components within normal limits  BASIC METABOLIC PANEL - Abnormal; Notable for the following:    Chloride 100 (*)    Glucose, Bld 108 (*)    Creatinine, Ser 1.45 (*)    Calcium 8.8 (*)    All other components within normal limits  BRAIN NATRIURETIC PEPTIDE - Abnormal; Notable for the following:    B Natriuretic Peptide 728.7 (*)    All other components within normal limits  MAGNESIUM - Abnormal; Notable for the following:    Magnesium 1.5 (*)    All other components within normal limits  I-STAT CG4 LACTIC ACID, ED -  Abnormal; Notable for the following:    Lactic Acid, Venous 2.12 (*)    All other components within normal limits  CULTURE, BLOOD (ROUTINE X 2)  CULTURE, BLOOD (ROUTINE X 2)  URINE CULTURE  PHOSPHORUS  URINALYSIS, ROUTINE W REFLEX MICROSCOPIC (NOT AT [facility])  TROPONIN I  TROPONIN I  TROPONIN I  BASIC METABOLIC PANEL  CBC  TSH  CBC  CREATININE, SERUM  I-STAT TROPOININ, ED  I-STAT CG4 LACTIC ACID, ED    EKG  EKG Interpretation  Date/Time:  Thursday December 08 2015 20:44:38 EDT Ventricular Rate:  115 PR Interval:    QRS Duration: 118 QT Interval:  379 QTC Calculation: 525 R Axis:   113 Text Interpretation:  Sinus tachycardia Biatrial enlargement Nonspecific intraventricular conduction delay Repol abnrm suggests ischemia, diffuse leads Borderline ST elevation, lateral leads Confirmed by ZAMMIT  MD, JOSEPH (  [id number]) on 12/08/2015 11:24:07 PM       Radiology Dg Chest 2 View  Result Date: 12/08/2015 CLINICAL DATA:  Shortness of breath and leg swelling. EXAM: CHEST  2 VIEW COMPARISON:  None. FINDINGS: Cardiac silhouette is enlarged. Enlargement of the central vascular structures. No large pleural effusions. Trachea is midline. Bony thorax is intact. No focal airspace disease. IMPRESSION: Cardiomegaly with vascular congestion. Electronically Signed   By: Richarda Overlie M.D.   On: 12/08/2015 21:44   Ct Angio Chest Pe W And/or Wo Contrast  Result Date: 12/09/2015 CLINICAL DATA:  Shortness of breath for 1 year. Tachycardia. Increased swelling in the legs. Difficulty breathing. Fever. EXAM: CT ANGIOGRAPHY CHEST WITH CONTRAST TECHNIQUE: Multidetector CT imaging of the chest was performed using the standard protocol during bolus administration of intravenous contrast. Multiplanar CT image reconstructions and MIPs were obtained to evaluate the vascular anatomy. CONTRAST:  100 mL Isovue 370 COMPARISON:  None. FINDINGS: Technically adequate study with moderately good opacification of the central and  proximal segmental pulmonary arteries. More peripheral pulmonary arteries are not well opacified and remain indeterminate. Visualized pulmonary arteries demonstrate no significant central pulmonary embolus. Diffuse cardiac enlargement. Enlarged lymph nodes throughout the mediastinum are nonspecific but probably reactive. Esophagus is decompressed. Evaluation of lungs is limited due to respiratory motion artifact. There is a diffuse mosaic attenuation pattern to the lungs which may represent motion artifact, air trapping, or edema. Airways are patent. No pleural effusions. No pneumothorax. Included portions of the upper abdominal organs are grossly unremarkable. Degenerative changes in the thoracic spine. Review of the MIP images confirms the above findings. IMPRESSION: No large central pulmonary embolus demonstrated. Cardiac enlargement with mosaic attenuation pattern in the lungs possibly due to edema, air trapping, or motion artifact. Mild mediastinal lymphadenopathy is probably reactive although nonspecific. Electronically Signed   By: Burman Nieves M.D.   On: 12/09/2015 00:26    Procedures Procedures (including critical care time)  DIAGNOSTIC STUDIES: Oxygen Saturation is 88% on RA, low by my interpretation.    COORDINATION OF CARE: 8:56 PM Discussed treatment plan with pt at bedside and pt agreed to plan.  Medications Ordered in ED Medications  albuterol (PROVENTIL) (2.5 MG/3ML) 0.083% nebulizer solution 2.5 mg (not administered)  irbesartan-hydrochlorothiazide (AVALIDE) 300-12.5 MG per tablet 1 tablet (not administered)  colchicine tablet 0.6 mg (not administered)  acetaminophen (TYLENOL) tablet 650 mg (not administered)    Or  acetaminophen (TYLENOL) suppository 650 mg (not administered)  ondansetron (ZOFRAN) tablet 4 mg (not administered)    Or  ondansetron (ZOFRAN) injection 4 mg (not administered)  furosemide (LASIX) injection 60 mg (not administered)  potassium chloride SA  (K-DUR,KLOR-CON) CR tablet 20 mEq (not administered)  enoxaparin (LOVENOX) injection 40 mg (not administered)  doxycycline (VIBRAMYCIN) 100 mg in dextrose 5 % 250 mL IVPB (not administered)  albuterol (PROVENTIL) (2.5 MG/3ML) 0.083% nebulizer solution 5 mg (5 mg Nebulization Given 12/08/15 2139)  ipratropium (ATROVENT) nebulizer solution 0.5 mg (0.5 mg Nebulization Given 12/08/15 2139)  acetaminophen (TYLENOL) tablet 1,000 mg (1,000 mg Oral Given 12/08/15 2139)  iopamidol (ISOVUE-370) 76 % injection 100 mL (100 mLs Intravenous Contrast Given 12/08/15 2353)  furosemide (LASIX) injection 40 mg (40 mg Intravenous Given 12/09/15 0126)     Initial Impression / Assessment and Plan / ED Course  I have reviewed the triage vital signs and the nursing notes.  Pertinent labs & imaging results that were available during my care of the patient were reviewed by me and considered in my  medical decision making (see chart for details).  Clinical Course  Value Comment By Time   Patient remains tachycardic and hypoxic. Now requiring 2 L of oxygen.  Concern for possible PE. CT angiogram pending. Dahlia Client Selenne Coggin, PA-C 08/17 2314  WBC: (!) 12.8 Mild leukocytosis noted; no anemia. Dahlia Client Van Ehlert, PA-C 08/17 2314  B Natriuretic Peptide: (!) 728.7 Elevated BNP. Dahlia Client Skai Lickteig, PA-C 08/17 2314  Magnesium: (!) 1.5 Magnesium low. Dahlia Client Alichia Alridge, PA-C 08/17 2314  Lactic Acid, Venous: (!!) 2.12 Elevated lactic acid. Patient has received some judicious fluids.  Doubt sepsis at this time as presentation is more consistent with CHF exacerbation. Dahlia Client Dyanara Cozza, PA-C 08/17 2314  Troponin i, poc: 0.07 (Reviewed) Dierdre Forth, PA-C 08/17 2315  DG Chest 2 View Cardiomegaly and vascular congestion on chest x-ray. No evidence of pneumonia. Dierdre Forth, PA-C 08/17 2315  CT Angio Chest PE W and/or Wo Contrast No PE Dierdre Forth, PA-C 08/18 3149   CHF exacerbation.  Will need admission for  persistent hypoxia, fluid overload.   Dierdre Forth, PA-C 08/18 0040   Discussed with Dr. Toniann Fail who will admit to tele inpatient.  IV lasix ordered. Dahlia Client Roseanna Koplin, PA-C 08/18 0049  Lactic Acid, Venous: 1.52 Improved Dierdre Forth, PA-C 08/18 7026   Pt with Hypoxia, tachycardia and shortness of breath. Low-grade fever on arrival however patient does not appear septic. Slightly elevated lactic acid. Small fluid bolus given however weight-based fluid dosing is not given due to clear volume overload.  Elevated BNP. Tachycardia persists. Patient now requiring oxygen to maintain oxygen saturations. He does report breathing better after albuterol treatment.  CT without PE. Improving lactic acid. Patient given Lasix and admitted for CHF exacerbation. Patient will need cardiac workup.  The patient was discussed with and seen by Dr. Estell Harpin who agrees with the treatment plan.   Final Clinical Impressions(s) / ED Diagnoses   Final diagnoses:  Dyspnea  DOE (dyspnea on exertion)  Peripheral edema  Anasarca  Elevated lactic acid level  Hypoxia    New Prescriptions New Prescriptions   No medications on file     Dierdre Forth, PA-C 12/09/15 0215    Bethann Berkshire, MD 12/09/15 1515

## 2015-12-08 NOTE — ED Triage Notes (Signed)
Pt from home with complaints of SOB x about 1 year that he thinks is related to his bloodpressure medicine (HCTZ). Pt states he has had increased swelling in his legs despite the medication and he states he has had a difficult time breathing. Pt is febrile, tachycardic, and has decreased oxygen at time of assessment (85% initially and 89% after pt had been sitting for a few min)

## 2015-12-08 NOTE — ED Notes (Signed)
Pt is aware a urine sample is needed urinal at bedside. 

## 2015-12-08 NOTE — ED Notes (Signed)
Pt. Asked to give urine specimen, can't void at present moment.

## 2015-12-09 ENCOUNTER — Ambulatory Visit: Payer: Self-pay | Admitting: Internal Medicine

## 2015-12-09 ENCOUNTER — Encounter (HOSPITAL_COMMUNITY): Payer: Self-pay | Admitting: Internal Medicine

## 2015-12-09 ENCOUNTER — Inpatient Hospital Stay (HOSPITAL_COMMUNITY): Payer: BC Managed Care – PPO

## 2015-12-09 DIAGNOSIS — Z79899 Other long term (current) drug therapy: Secondary | ICD-10-CM | POA: Diagnosis not present

## 2015-12-09 DIAGNOSIS — I10 Essential (primary) hypertension: Secondary | ICD-10-CM

## 2015-12-09 DIAGNOSIS — Z6841 Body Mass Index (BMI) 40.0 and over, adult: Secondary | ICD-10-CM | POA: Diagnosis not present

## 2015-12-09 DIAGNOSIS — J40 Bronchitis, not specified as acute or chronic: Secondary | ICD-10-CM | POA: Diagnosis present

## 2015-12-09 DIAGNOSIS — Z8249 Family history of ischemic heart disease and other diseases of the circulatory system: Secondary | ICD-10-CM | POA: Diagnosis not present

## 2015-12-09 DIAGNOSIS — I248 Other forms of acute ischemic heart disease: Secondary | ICD-10-CM | POA: Diagnosis present

## 2015-12-09 DIAGNOSIS — R0602 Shortness of breath: Secondary | ICD-10-CM | POA: Diagnosis present

## 2015-12-09 DIAGNOSIS — I5041 Acute combined systolic (congestive) and diastolic (congestive) heart failure: Secondary | ICD-10-CM | POA: Diagnosis present

## 2015-12-09 DIAGNOSIS — I509 Heart failure, unspecified: Secondary | ICD-10-CM

## 2015-12-09 DIAGNOSIS — I130 Hypertensive heart and chronic kidney disease with heart failure and stage 1 through stage 4 chronic kidney disease, or unspecified chronic kidney disease: Secondary | ICD-10-CM | POA: Diagnosis present

## 2015-12-09 DIAGNOSIS — N182 Chronic kidney disease, stage 2 (mild): Secondary | ICD-10-CM | POA: Diagnosis present

## 2015-12-09 DIAGNOSIS — G4733 Obstructive sleep apnea (adult) (pediatric): Secondary | ICD-10-CM | POA: Diagnosis present

## 2015-12-09 DIAGNOSIS — J9691 Respiratory failure, unspecified with hypoxia: Secondary | ICD-10-CM | POA: Diagnosis not present

## 2015-12-09 DIAGNOSIS — I5031 Acute diastolic (congestive) heart failure: Secondary | ICD-10-CM

## 2015-12-09 DIAGNOSIS — R000 Tachycardia, unspecified: Secondary | ICD-10-CM | POA: Diagnosis present

## 2015-12-09 DIAGNOSIS — I428 Other cardiomyopathies: Secondary | ICD-10-CM | POA: Diagnosis present

## 2015-12-09 DIAGNOSIS — Z87891 Personal history of nicotine dependence: Secondary | ICD-10-CM | POA: Diagnosis not present

## 2015-12-09 LAB — CBC
HEMATOCRIT: 39.3 % (ref 39.0–52.0)
HEMATOCRIT: 38.9 % — AB (ref 39.0–52.0)
HEMOGLOBIN: 12.8 g/dL — AB (ref 13.0–17.0)
Hemoglobin: 12.9 g/dL — ABNORMAL LOW (ref 13.0–17.0)
MCH: 25 pg — ABNORMAL LOW (ref 26.0–34.0)
MCH: 25.2 pg — ABNORMAL LOW (ref 26.0–34.0)
MCHC: 32.6 g/dL (ref 30.0–36.0)
MCHC: 33.2 g/dL (ref 30.0–36.0)
MCV: 76.6 fL — ABNORMAL LOW (ref 78.0–100.0)
MCV: 76.1 fL — AB (ref 78.0–100.0)
PLATELETS: 305 10*3/uL (ref 150–400)
Platelets: 326 10*3/uL (ref 150–400)
RBC: 5.13 MIL/uL (ref 4.22–5.81)
RBC: 5.11 MIL/uL (ref 4.22–5.81)
RDW: 16.4 % — ABNORMAL HIGH (ref 11.5–15.5)
RDW: 16.3 % — ABNORMAL HIGH (ref 11.5–15.5)
WBC: 13.2 10*3/uL — AB (ref 4.0–10.5)
WBC: 12.2 10*3/uL — AB (ref 4.0–10.5)

## 2015-12-09 LAB — BASIC METABOLIC PANEL WITH GFR
Anion gap: 10 (ref 5–15)
BUN: 15 mg/dL (ref 6–20)
CO2: 29 mmol/L (ref 22–32)
Calcium: 8.6 mg/dL — ABNORMAL LOW (ref 8.9–10.3)
Chloride: 98 mmol/L — ABNORMAL LOW (ref 101–111)
Creatinine, Ser: 1.44 mg/dL — ABNORMAL HIGH (ref 0.61–1.24)
GFR calc Af Amer: >60 mL/min
GFR calc non Af Amer: >60 mL/min
Glucose, Bld: 141 mg/dL — ABNORMAL HIGH (ref 65–99)
Potassium: 3.7 mmol/L (ref 3.5–5.1)
Sodium: 137 mmol/L (ref 135–145)

## 2015-12-09 LAB — I-STAT CG4 LACTIC ACID, ED: Lactic Acid, Venous: 1.52 mmol/L (ref 0.5–1.9)

## 2015-12-09 LAB — ECHOCARDIOGRAM COMPLETE
Height: 73.5 in
Weight: 5600 oz

## 2015-12-09 LAB — CREATININE, SERUM
Creatinine, Ser: 1.52 mg/dL — ABNORMAL HIGH (ref 0.61–1.24)
GFR calc Af Amer: >60 mL/min
GFR calc non Af Amer: 59 mL/min — ABNORMAL LOW

## 2015-12-09 LAB — TROPONIN I
Troponin I: 0.06 ng/mL (ref <0.03)
Troponin I: 0.06 ng/mL (ref <0.03)
Troponin I: 0.04 ng/mL

## 2015-12-09 LAB — TSH: TSH: 2.665 u[IU]/mL (ref 0.350–4.500)

## 2015-12-09 MED ORDER — PERFLUTREN LIPID MICROSPHERE
1.0000 mL | INTRAVENOUS | Status: AC | PRN
  Administered 2015-12-09: 2 mL via INTRAVENOUS
  Filled 2015-12-09: qty 10

## 2015-12-09 MED ORDER — FUROSEMIDE 10 MG/ML IJ SOLN
60.0000 mg | Freq: Two times a day (BID) | INTRAMUSCULAR | Status: DC
  Administered 2015-12-09 – 2015-12-13 (×9): 60 mg via INTRAVENOUS
  Filled 2015-12-09 (×7): qty 6
  Filled 2015-12-09: qty 8
  Filled 2015-12-09: qty 6

## 2015-12-09 MED ORDER — ENOXAPARIN SODIUM 40 MG/0.4ML ~~LOC~~ SOLN
40.0000 mg | SUBCUTANEOUS | Status: DC
  Filled 2015-12-09: qty 0.4

## 2015-12-09 MED ORDER — LOSARTAN POTASSIUM 50 MG PO TABS
50.0000 mg | ORAL_TABLET | Freq: Two times a day (BID) | ORAL | Status: AC
  Administered 2015-12-10 (×2): 50 mg via ORAL
  Filled 2015-12-09 (×3): qty 1

## 2015-12-09 MED ORDER — ACETAMINOPHEN 650 MG RE SUPP: 650.0000 mg | Freq: Four times a day (QID) | RECTAL | Status: DC | PRN

## 2015-12-09 MED ORDER — ALBUTEROL SULFATE (2.5 MG/3ML) 0.083% IN NEBU: 2.5000 mg | INHALATION_SOLUTION | RESPIRATORY_TRACT | Status: AC | PRN

## 2015-12-09 MED ORDER — ACETAMINOPHEN 325 MG PO TABS
650.0000 mg | ORAL_TABLET | Freq: Four times a day (QID) | ORAL | Status: DC | PRN
  Administered 2015-12-11 – 2015-12-13 (×3): 650 mg via ORAL
  Filled 2015-12-09 (×3): qty 2

## 2015-12-09 MED ORDER — POTASSIUM CHLORIDE CRYS ER 20 MEQ PO TBCR
20.0000 meq | EXTENDED_RELEASE_TABLET | Freq: Every day | ORAL | Status: DC
  Administered 2015-12-09 – 2015-12-10 (×2): 20 meq via ORAL
  Filled 2015-12-09 (×2): qty 1

## 2015-12-09 MED ORDER — COLCHICINE 0.6 MG PO TABS
0.6000 mg | ORAL_TABLET | Freq: Every day | ORAL | Status: DC | PRN
  Filled 2015-12-09: qty 1

## 2015-12-09 MED ORDER — PERFLUTREN LIPID MICROSPHERE
INTRAVENOUS | Status: AC
Start: 2015-12-09 — End: 2015-12-09
  Filled 2015-12-09: qty 10

## 2015-12-09 MED ORDER — POTASSIUM CHLORIDE CRYS ER 20 MEQ PO TBCR
40.0000 meq | EXTENDED_RELEASE_TABLET | Freq: Once | ORAL | Status: AC
  Administered 2015-12-09: 40 meq via ORAL
  Filled 2015-12-09: qty 2

## 2015-12-09 MED ORDER — DOXYCYCLINE HYCLATE 100 MG IV SOLR
100.0000 mg | Freq: Two times a day (BID) | INTRAVENOUS | Status: DC
  Administered 2015-12-09 – 2015-12-10 (×4): 100 mg via INTRAVENOUS
  Filled 2015-12-09 (×5): qty 100

## 2015-12-09 MED ORDER — SPIRONOLACTONE 25 MG PO TABS
12.5000 mg | ORAL_TABLET | Freq: Every day | ORAL | Status: DC
  Administered 2015-12-09 – 2015-12-10 (×2): 12.5 mg via ORAL
  Filled 2015-12-09 (×2): qty 1

## 2015-12-09 MED ORDER — ENOXAPARIN SODIUM 60 MG/0.6ML ~~LOC~~ SOLN
60.0000 mg | SUBCUTANEOUS | Status: DC
  Administered 2015-12-09 – 2015-12-10 (×2): 60 mg via SUBCUTANEOUS
  Filled 2015-12-09 (×2): qty 0.6

## 2015-12-09 MED ORDER — HYDROCHLOROTHIAZIDE 12.5 MG PO CAPS
12.5000 mg | ORAL_CAPSULE | Freq: Every day | ORAL | Status: DC
  Filled 2015-12-09: qty 1

## 2015-12-09 MED ORDER — FUROSEMIDE 10 MG/ML IJ SOLN
40.0000 mg | Freq: Once | INTRAMUSCULAR | Status: AC
  Administered 2015-12-09: 40 mg via INTRAVENOUS
  Filled 2015-12-09: qty 4

## 2015-12-09 MED ORDER — IRBESARTAN-HYDROCHLOROTHIAZIDE 300-12.5 MG PO TABS: 1.0000 | ORAL_TABLET | Freq: Every day | ORAL | Status: DC

## 2015-12-09 MED ORDER — ONDANSETRON HCL 4 MG PO TABS: 4.0000 mg | ORAL_TABLET | Freq: Four times a day (QID) | ORAL | Status: DC | PRN

## 2015-12-09 MED ORDER — ONDANSETRON HCL 4 MG/2ML IJ SOLN: 4.0000 mg | Freq: Four times a day (QID) | INTRAMUSCULAR | Status: DC | PRN

## 2015-12-09 MED ORDER — IRBESARTAN 300 MG PO TABS
300.0000 mg | ORAL_TABLET | Freq: Every day | ORAL | Status: DC
  Administered 2015-12-09: 300 mg via ORAL
  Filled 2015-12-09: qty 1

## 2015-12-09 MED ORDER — DIGOXIN 250 MCG PO TABS
0.2500 mg | ORAL_TABLET | Freq: Every day | ORAL | Status: DC
  Administered 2015-12-09 – 2015-12-14 (×6): 0.25 mg via ORAL
  Filled 2015-12-09 (×6): qty 1

## 2015-12-09 NOTE — ED Notes (Signed)
PT can go to floor at 11;45.

## 2015-12-09 NOTE — H&P (Signed)
History and Physical    Antonio RiggsJason E Brentlinger ZOX:[id number] DOB: 12/30/1980 DOA: 12/08/2015  PCP: No PCP Per Patient  Patient coming from: Home.  Chief Complaint: Shortness of breath and lower extremity edema.  HPI: Antonio Morgan is a 35 y.o. male with hypertension and possible CHF with family history of cardiomyopathy and father receiving cardiac transplant presents to the ER because of increasing lower extremity edema and exertional shortness of breath over the last few weeks. Patient also has orthopnea. Denies any chest pain but has been having some productive cough last few days.. Patient states despite taking Lasix patient's lower extremity edema has been worsening. In the ER on exam patient has marked edema of the lower extremities bilaterally. CT angiogram of the chest was negative for PE. Patient is being admitted for further management of possible CHF.   ED Course: Lasix 40 mg IV was given in the ER.  Review of Systems: As per HPI, rest all negative.   Past Medical History:  Diagnosis Date  . Gout   . Hypertension   . Obese     History reviewed. No pertinent surgical history.   reports that he quit smoking about 2 years ago. He quit after 10.00 years of use. He has never used smokeless tobacco. He reports that he does not drink alcohol or use drugs.  Allergies  Allergen Reactions  . Lisinopril Cough    Family History  Problem Relation Age of Onset  . Heart disease Father     heart transplant  . Hypertension Mother     Prior to Admission medications   Medication Sig Start Date End Date Taking? Authorizing Provider  colchicine 0.6 MG tablet Take 0.6 mg by mouth daily as needed (gout).    Yes Historical Provider, MD  furosemide (LASIX) 20 MG tablet Take 1 tablet (20 mg total) by mouth 2 (two) times daily. 03/16/15  Yes Chrystie NoseKenneth C Hilty, MD  IBUPROFEN PO Take 400 mg by mouth as needed (pain).    Yes Historical Provider, MD  irbesartan-hydrochlorothiazide (AVALIDE)  300-12.5 MG tablet TAKE 1 TABLET DAILY 11/14/15  Yes Chrystie NoseKenneth C Hilty, MD    Physical Exam: Vitals:   12/08/15 2200 12/08/15 2313 12/08/15 2330 12/09/15 0149  BP: 137/72 141/99 137/99 138/88  Pulse: 115 119 120 106  Resp: (!) 27 20  18   Temp:      TempSrc:      SpO2: 92% 98% 98% 98%  Weight:      Height:          Constitutional: Not in distress. Vitals:   12/08/15 2200 12/08/15 2313 12/08/15 2330 12/09/15 0149  BP: 137/72 141/99 137/99 138/88  Pulse: 115 119 120 106  Resp: (!) 27 20  18   Temp:      TempSrc:      SpO2: 92% 98% 98% 98%  Weight:      Height:       Eyes: Anicteric no pallor. ENMT: No discharge from the ears eyes nose and mouth. Neck: No mass felt. No JVD appreciated. Respiratory: No rhonchi or crepitations. Cardiovascular: S1 and S2 heard. Abdomen: Distended soft non-tender. Musculoskeletal: Bilateral lower extremity edema extending up to the thighs. Skin: No rash. Neurologic: Alert awake oriented to time place and person. Moves all extremities. Psychiatric: Appears normal.   Labs on Admission: I have personally reviewed following labs and imaging studies  CBC:  Recent Labs Lab 12/08/15 2140  WBC 12.8*  NEUTROABS 10.7*  HGB 13.3  HCT 40.9  MCV 77.0*  PLT 339   Basic Metabolic Panel:  Recent Labs Lab 12/08/15 2140  NA 137  K 4.0  CL 100*  CO2 28  GLUCOSE 108*  BUN 16  CREATININE 1.45*  CALCIUM 8.8*  MG 1.5*  PHOS 3.0   GFR: Estimated Creatinine Clearance: 115 mL/min (by C-G formula based on SCr of 1.45 mg/dL). Liver Function Tests: No results for input(s): AST, ALT, ALKPHOS, BILITOT, PROT, ALBUMIN in the last 168 hours. No results for input(s): LIPASE, AMYLASE in the last 168 hours. No results for input(s): AMMONIA in the last 168 hours. Coagulation Profile: No results for input(s): INR, PROTIME in the last 168 hours. Cardiac Enzymes: No results for input(s): CKTOTAL, CKMB, CKMBINDEX, TROPONINI in the last 168 hours. BNP  (last 3 results) No results for input(s): PROBNP in the last 8760 hours. HbA1C: No results for input(s): HGBA1C in the last 72 hours. CBG: No results for input(s): GLUCAP in the last 168 hours. Lipid Profile: No results for input(s): CHOL, HDL, LDLCALC, TRIG, CHOLHDL, LDLDIRECT in the last 72 hours. Thyroid Function Tests: No results for input(s): TSH, T4TOTAL, FREET4, T3FREE, THYROIDAB in the last 72 hours. Anemia Panel: No results for input(s): VITAMINB12, FOLATE, FERRITIN, TIBC, IRON, RETICCTPCT in the last 72 hours. Urine analysis:    Component Value Date/Time   COLORURINE YELLOW 12/08/2015 2247   APPEARANCEUR CLEAR 12/08/2015 2247   LABSPEC 1.013 12/08/2015 2247   PHURINE 7.5 12/08/2015 2247   GLUCOSEU NEGATIVE 12/08/2015 2247   HGBUR NEGATIVE 12/08/2015 2247   BILIRUBINUR NEGATIVE 12/08/2015 2247   KETONESUR NEGATIVE 12/08/2015 2247   PROTEINUR NEGATIVE 12/08/2015 2247   NITRITE NEGATIVE 12/08/2015 2247   LEUKOCYTESUR NEGATIVE 12/08/2015 2247   Sepsis Labs: @LABRCNTIP (procalcitonin:4,lacticidven:4) )No results found for this or any previous visit (from the past 240 hour(s)).   Radiological Exams on Admission: Dg Chest 2 View  Result Date: 12/08/2015 CLINICAL DATA:  Shortness of breath and leg swelling. EXAM: CHEST  2 VIEW COMPARISON:  None. FINDINGS: Cardiac silhouette is enlarged. Enlargement of the central vascular structures. No large pleural effusions. Trachea is midline. Bony thorax is intact. No focal airspace disease. IMPRESSION: Cardiomegaly with vascular congestion. Electronically Signed   By: Richarda Overlie M.D.   On: 12/08/2015 21:44   Ct Angio Chest Pe W And/or Wo Contrast  Result Date: 12/09/2015 CLINICAL DATA:  Shortness of breath for 1 year. Tachycardia. Increased swelling in the legs. Difficulty breathing. Fever. EXAM: CT ANGIOGRAPHY CHEST WITH CONTRAST TECHNIQUE: Multidetector CT imaging of the chest was performed using the standard protocol during bolus  administration of intravenous contrast. Multiplanar CT image reconstructions and MIPs were obtained to evaluate the vascular anatomy. CONTRAST:  100 mL Isovue 370 COMPARISON:  None. FINDINGS: Technically adequate study with moderately good opacification of the central and proximal segmental pulmonary arteries. More peripheral pulmonary arteries are not well opacified and remain indeterminate. Visualized pulmonary arteries demonstrate no significant central pulmonary embolus. Diffuse cardiac enlargement. Enlarged lymph nodes throughout the mediastinum are nonspecific but probably reactive. Esophagus is decompressed. Evaluation of lungs is limited due to respiratory motion artifact. There is a diffuse mosaic attenuation pattern to the lungs which may represent motion artifact, air trapping, or edema. Airways are patent. No pleural effusions. No pneumothorax. Included portions of the upper abdominal organs are grossly unremarkable. Degenerative changes in the thoracic spine. Review of the MIP images confirms the above findings. IMPRESSION: No large central pulmonary embolus demonstrated. Cardiac enlargement with mosaic attenuation pattern in the lungs possibly due  to edema, air trapping, or motion artifact. Mild mediastinal lymphadenopathy is probably reactive although nonspecific. Electronically Signed   By: Burman Nieves M.D.   On: 12/09/2015 00:26    EKG: Independently reviewed. Sinus tachycardia.  Assessment/Plan Active Problems:   Morbid obesity (HCC)   Essential hypertension, malignant   Acute congestive heart failure (HCC)   CHF (congestive heart failure) (HCC)    1. Acute congestive heart failure EF unknown - check 2-D echo. I have placed patient on Lasix 60 mg IV every 12 will probably need further increase in dose. Closely follow daily weights and intake output metabolic panel cardiac markers. Continue ARB. Check a UA for any proteinuria. 2. Mild fever with productive cough and leukocytosis -  possibly from bronchitis. Patient on doxycycline. Follow blood cultures. 3. Hypertension - continue ARB.   DVT prophylaxis: Lovenox. Code Status: Full code.  Family Communication: Patient's family at the bedside.  Disposition Plan: Home.  Consults called: None.  Admission status: Inpatient. Telemetry. Likely stay 2-3 days.    Eduard Clos MD [facility] Pager [phone].  If 7PM-7AM, please contact night-coverage [email or web address] Password [email or web address]  12/09/2015, 2:04 AM

## 2015-12-09 NOTE — Consult Note (Addendum)
Cardiology Consult    Patient ID: Antonio Morgan MRN: [id number], DOB/AGE: 11/15/1980   Admit date: 12/08/2015 Date of Consult: 12/09/2015  Primary Physician: Fredirick Maudlin, MD Reason for Consult: CHF, Elevated Troponin Primary Cardiologist: Dr. Rennis Golden Requesting Provider: Dr. Elisabeth Pigeon   History of Present Illness    Antonio Morgan is a 35 y.o. male with past medical history of HTN, obesity, and presumed hypertensive heart diease who presented to [facility] ED on 12/08/2015 for worsening dyspnea for the past year.   He was last seen by Dr, Rennis Golden in 2015 for follow-up of an ER visit when he reported being short of breath and had a BNP > 2000. He was started on Irbesartan/HCTZ and an echocardiogram was recommended. This was never performed and he has not followed up since. Reported not having insurance at that time which is a likely barrier.   Patient reports his weight has gone up "significantly" over the past few months. Notes worsening lower extremity edema, orthopnea, and dyspnea with exertion. Significant other at the bedside reports he cannot carry groceries inside the house without having to stop and rest.   He has two medication bottles with him today. One being the Irbesartan/HCTZ and the other being Lasix 20mg  daily. These are from his original Rx in 2015. Says he has taken them occasionally since then. Says they make him "feel weird".   Denies any chest discomfort or palpitations. No prior cardiac history.   Reports his father had a heart transplant and HTN. Mother had HTN. Denies any current tobacco use, alcohol use, or recreational drug use.   While in the ED, he was tachycardiac into the low-100's. Oxygen saturations were initially 85%. Labs showed a BNP of 728. WBC 12.8, Hgb 13.3, and platelets 339. Electrolytes WNL. Creatinine 1.45 (was 1.35 one year ago). Mg 1.5. Lactic Acid 2.12 with repeat of 1.52. TSH 2.665. Cyclic troponin values have been flat at 0.06 thus far. EKG  shows sinus tachycardia, HR 115, with TWI in inferolateral leads, likely a repol abnormality in the setting of LVH.  CXR showed cardiomegaly with vascular congestion. CTA negative for PE. Cardiac enlargement with mosaic attenuation pattern in the lungs possibly due to edema, air trapping, or motion artifact was noted. Echocardiogram is pending.   He has been started on IV Lasix 60mg  Q12H with a recorded net output of -3.2L thus far.    Past Medical History   Past Medical History:  Diagnosis Date  . Gout   . Hypertension   . Obese     History reviewed. No pertinent surgical history.   Allergies  Allergies  Allergen Reactions  . Lisinopril Cough    Inpatient Medications    . doxycycline (VIBRAMYCIN) IV  100 mg Intravenous BID  . enoxaparin (LOVENOX) injection  60 mg Subcutaneous Q24H  . furosemide  60 mg Intravenous Q12H  . hydrochlorothiazide  12.5 mg Oral Daily  . irbesartan  300 mg Oral Daily  . potassium chloride  20 mEq Oral Daily    Family History    Family History  Problem Relation Age of Onset  . Heart disease Father     heart transplant  . Hypertension Mother     Social History    Social History   Social History  . Marital status: Single    Spouse name: N/A  . Number of children: N/A  . Years of education: 61   Occupational History  . guitarist    Social History Main Topics  .  Smoking status: Former Smoker    Years: 10.00    Quit date: 10/28/2013  . Smokeless tobacco: Never Used  . Alcohol use No  . Drug use: No  . Sexual activity: Not on file   Other Topics Concern  . Not on file   Social History Narrative  . No narrative on file     Review of Systems    General:  No chills, fever, night sweats or weight changes.  Cardiovascular:  No chest pain, palpitations, paroxysmal nocturnal dyspnea. Positive for dyspnea with exertion, orthopnea, and edema.  Dermatological: No rash, lesions/masses Respiratory: No cough, Positive for  dyspnea Urologic: No hematuria, dysuria Abdominal:   No nausea, vomiting, diarrhea, bright red blood per rectum, melena, or hematemesis Neurologic:  No visual changes, wkns, changes in mental status. All other systems reviewed and are otherwise negative except as noted above.  Physical Exam    Blood pressure 135/100, pulse 108, temperature 98.2 F (36.8 C), resp. rate 22, height 6' 1.5" (1.867 m), weight (!) 350 lb (158.8 kg), SpO2 97 %.  General: Pleasant, obese African American male appearing in NAD Psych: Normal affect. Neuro: Alert and oriented X 3. Moves all extremities spontaneously. HEENT: Normal  Neck: Supple without bruits. JVD difficult to assess secondary to body habitus. Lungs:  Resp regular and unlabored, decreased breath sounds at bases bilaterally. Heart: tachy regular +s3 Abdomen: Soft, non-tender, non-distended, BS + x 4.  Extremities: No clubbing or cyanosis. 1-2+ lower extremity edema bilaterally. DP/PT/Radials 2+ and equal bilaterally.  Labs    Troponin [facility] Test)  Recent Labs  12/08/15 2149  TROPIPOC 0.07    Recent Labs  12/09/15 0250 12/09/15 0843  TROPONINI 0.06* 0.06*   Lab Results  Component Value Date   WBC 12.2 (H) 12/09/2015   HGB 12.9 (L) 12/09/2015   HCT 38.9 (L) 12/09/2015   MCV 76.1 (L) 12/09/2015   PLT 305 12/09/2015    Recent Labs Lab 12/09/15 0514  NA 137  K 3.7  CL 98*  CO2 29  BUN 15  CREATININE 1.44*  CALCIUM 8.6*  GLUCOSE 141*   No results found for: CHOL, HDL, LDLCALC, TRIG No results found for: [facility]   Radiology Studies    Dg Chest 2 View  Result Date: 12/08/2015 CLINICAL DATA:  Shortness of breath and leg swelling. EXAM: CHEST  2 VIEW COMPARISON:  None. FINDINGS: Cardiac silhouette is enlarged. Enlargement of the central vascular structures. No large pleural effusions. Trachea is midline. Bony thorax is intact. No focal airspace disease. IMPRESSION: Cardiomegaly with vascular congestion. Electronically  Signed   By: Richarda Overlie M.D.   On: 12/08/2015 21:44   Ct Angio Chest Pe W And/or Wo Contrast  Result Date: 12/09/2015 CLINICAL DATA:  Shortness of breath for 1 year. Tachycardia. Increased swelling in the legs. Difficulty breathing. Fever. EXAM: CT ANGIOGRAPHY CHEST WITH CONTRAST TECHNIQUE: Multidetector CT imaging of the chest was performed using the standard protocol during bolus administration of intravenous contrast. Multiplanar CT image reconstructions and MIPs were obtained to evaluate the vascular anatomy. CONTRAST:  100 mL Isovue 370 COMPARISON:  None. FINDINGS: Technically adequate study with moderately good opacification of the central and proximal segmental pulmonary arteries. More peripheral pulmonary arteries are not well opacified and remain indeterminate. Visualized pulmonary arteries demonstrate no significant central pulmonary embolus. Diffuse cardiac enlargement. Enlarged lymph nodes throughout the mediastinum are nonspecific but probably reactive. Esophagus is decompressed. Evaluation of lungs is limited due to respiratory motion artifact. There is  a diffuse mosaic attenuation pattern to the lungs which may represent motion artifact, air trapping, or edema. Airways are patent. No pleural effusions. No pneumothorax. Included portions of the upper abdominal organs are grossly unremarkable. Degenerative changes in the thoracic spine. Review of the MIP images confirms the above findings. IMPRESSION: No large central pulmonary embolus demonstrated. Cardiac enlargement with mosaic attenuation pattern in the lungs possibly due to edema, air trapping, or motion artifact. Mild mediastinal lymphadenopathy is probably reactive although nonspecific. Electronically Signed   By: Burman NievesWilliam  Stevens M.D.   On: 12/09/2015 00:26    EKG & Cardiac Imaging    EKG: Sinus tachycardia, HR 115, with TWI in inferolateral leads, likely a repol abnormality in the setting of LVH.   Echocardiogram:  Pending  Assessment & Plan    1. Acute CHF Exacerbation  - echocardiogram pending to differentiate type of CHF. Reports a family history of heart disease with his father requiring a heart transplant. - notes worsening lower extremity edema, orthopnea, and dyspnea with exertion for the past several months. Weight has "increased significantly". Unknown baseline. Not compliant with antihypertensive medications (still has Rx from 2015)  - BNP 728 on admission. CXR showed cardiomegaly with vascular congestion.  - started on IV Lasix 60mg  Q12H with a recorded net output of -3.2L thus far. Continue with current dosing if kidney function remains stable. May be able to increase to 80mg  Q12H. Monitor I&O's and obtain daily weights. Daily BMET. - further recommendations following echocardiogram results.   2. Elevated Troponin - values have been flat at 0.06 and 0.06. - EKG shows sinus tachycardia, HR 115, with TWI in inferolateral leads, likely a repol abnormality in the setting of LVH.  - likely secondary to CHF exacerbation. Would not pursue further ischemic evaluation at this time unless EF significantly reduced.  3. HTN - BP has been 123/70 - 155/106 while admitted. - continue ARB. Will discontinue HCTZ while receiving IV Lasix.   4. Stage 2 CKD - creatinine 1.45 on admission, was 1.35 one year ago. - follow BMET daily while receiving IV diuresis  5. Morbid obesity.   6. Probable OSA - wife reports heavy snoring and periods of apnea. Will need outpatient sleep study   Signed, Ellsworth LennoxBrittany M Strader, PA-C 12/09/2015, 2:40 PM Pager: [phone]  Patient seen and examined with Randall AnBrittany Strader, PA-C. We discussed all aspects of the encounter. I agree with the assessment and plan as stated above.   Echo reviewed personally and shows severe biventricular dysfunction with LVEF of 10-15%. 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 Other  possible etiologies = ischemic, HTN, OSA, sarcoid (mild mediastinal LAD on CT).   He is quite tenuous with sinus tachycardia and volume overload. Will continue IV lasix. Switch to losartan. Start digoxin and spiro. Place PICC and check co-ox and CVPs. [facility]. Will need cath next week. Body habitus will likely not permit cMRI. QRS narrow.   He had 9 beat run NSVT. Check mag and K.   Luisfelipe Engelstad,MD 5:07 PM

## 2015-12-09 NOTE — Progress Notes (Signed)
9 beat run of vtach at 1647.  Asymptomatic.  Notified MD via text page.  Will continue to monitor.

## 2015-12-09 NOTE — Progress Notes (Addendum)
Patient ID: SHELIA OHLER, male   DOB: Mar 28, 1981, 35 y.o.   MRN: [id number]   Patient admitted after midnight. For details please refer to admission note done 12/09/2015. Patient seen and examined at the bedside. Family at the bedside.  35 y.o. male with hypertension and possible CHF with significant family history of cardiomyopathy and his father having had cardiac transplant. Pt presented to [facility] ED with worsening lower extremity edema and exertional shortness of breath over the last few weeks PTA. Patient also reported orthopnea.  In ED, pt was hemodynamically stable. CT angiogram of the chest showed no PE. Patient was given Lasix IV for management of acute decompensated CHF.  Acute diastolic congestive heart failure - 2-D echo on this admission is pending - BNP 728 on this admission - Continue IV Lasix - Monitor on telemetry  Mild troponin elevation - Likely demand ischemia from acute decompensated CHF - Appreciate cardiology input  Manson Passey [facility] [phone]

## 2015-12-09 NOTE — Progress Notes (Signed)
  Echocardiogram 2D Echocardiogram with Definity has been performed.  Cathie Beams 12/09/2015, 3:23 PM

## 2015-12-09 NOTE — ED Notes (Signed)
Pt stated he didn't want to be stuck for labs. He wanted labs drawn from IV. RN made aware

## 2015-12-09 NOTE — ED Notes (Signed)
Patient ambulated to bathroom to have BM without assistance.

## 2015-12-09 NOTE — Progress Notes (Signed)
Pt. Admitted from ED today.  I explained our policy of skin assessment by 2 RNs and pt refused to allow his backside to be assessed.  He states that he does not have any skin breakdown and all visible skin appears intact.

## 2015-12-10 DIAGNOSIS — I5021 Acute systolic (congestive) heart failure: Secondary | ICD-10-CM

## 2015-12-10 LAB — BASIC METABOLIC PANEL
ANION GAP: 7 (ref 5–15)
BUN: 11 mg/dL (ref 6–20)
CALCIUM: 8.6 mg/dL — AB (ref 8.9–10.3)
CO2: 36 mmol/L — ABNORMAL HIGH (ref 22–32)
Chloride: 92 mmol/L — ABNORMAL LOW (ref 101–111)
Creatinine, Ser: 1.29 mg/dL — ABNORMAL HIGH (ref 0.61–1.24)
GLUCOSE: 112 mg/dL — AB (ref 65–99)
POTASSIUM: 3.5 mmol/L (ref 3.5–5.1)
SODIUM: 135 mmol/L (ref 135–145)

## 2015-12-10 LAB — URINE CULTURE

## 2015-12-10 LAB — MRSA PCR SCREENING: MRSA BY PCR: NEGATIVE

## 2015-12-10 MED ORDER — SODIUM CHLORIDE 0.9% FLUSH: 10.0000 mL | INTRAVENOUS | Status: DC | PRN

## 2015-12-10 MED ORDER — SODIUM CHLORIDE 0.9% FLUSH
10.0000 mL | Freq: Two times a day (BID) | INTRAVENOUS | Status: DC
  Administered 2015-12-10: 10 mL
  Administered 2015-12-10: 20 mL
  Administered 2015-12-11 – 2015-12-14 (×5): 10 mL

## 2015-12-10 MED ORDER — SACUBITRIL-VALSARTAN 24-26 MG PO TABS
1.0000 | ORAL_TABLET | Freq: Two times a day (BID) | ORAL | Status: DC
  Administered 2015-12-11 – 2015-12-12 (×3): 1 via ORAL
  Filled 2015-12-10 (×3): qty 1

## 2015-12-10 MED ORDER — DOXYCYCLINE HYCLATE 100 MG PO TABS
100.0000 mg | ORAL_TABLET | Freq: Two times a day (BID) | ORAL | Status: DC
  Administered 2015-12-10 – 2015-12-14 (×8): 100 mg via ORAL
  Filled 2015-12-10 (×8): qty 1

## 2015-12-10 MED ORDER — ENOXAPARIN SODIUM 80 MG/0.8ML ~~LOC~~ SOLN
80.0000 mg | SUBCUTANEOUS | Status: DC
  Administered 2015-12-11 – 2015-12-13 (×3): 80 mg via SUBCUTANEOUS
  Filled 2015-12-10 (×3): qty 0.8

## 2015-12-10 NOTE — Progress Notes (Signed)
Lovenox DVT prophylaxis dose adjusted for BMI > 30 and conservative weight 350 lbs. CBC wnl and stable with no reported s/s bleeding. Will continue to monitor and make changes PRN.   [name]. Bonnye Fava, PharmD, BCPS, CPP Clinical Pharmacist Pager: [phone] Phone: [phone] 12/10/2015 11:37 AM

## 2015-12-10 NOTE — Progress Notes (Signed)
Patient ID: Antonio Morgan, male   DOB: 1980-06-21, 35 y.o.   MRN: [id number]   SUBJECTIVE: Good diuresis yesterday.  No BMET today and do not think weight is accurate.  Breathing much better.   Scheduled Meds: . digoxin  0.25 mg Oral Daily  . doxycycline (VIBRAMYCIN) IV  100 mg Intravenous BID  . enoxaparin (LOVENOX) injection  60 mg Subcutaneous Q24H  . furosemide  60 mg Intravenous Q12H  . losartan  50 mg Oral BID  . potassium chloride  20 mEq Oral Daily  . [START ON 12/11/2015] sacubitril-valsartan  1 tablet Oral BID  . spironolactone  12.5 mg Oral Daily   Continuous Infusions:  PRN Meds:.acetaminophen **OR** acetaminophen, colchicine, ondansetron **OR** ondansetron (ZOFRAN) IV    Vitals:   12/10/15 0800 12/10/15 0900 12/10/15 1000 12/10/15 1031  BP: 129/85     Pulse: (!) 103 (!) 107 (!) 116 (!) 106  Resp: (!) 24 (!) 24    Temp:  98.1 F (36.7 C)    TempSrc:  Oral    SpO2: 97% 96% (!) 77%   Weight:      Height:        Intake/Output Summary (Last 24 hours) at 12/10/15 1050 Last data filed at 12/10/15 1030  Gross per 24 hour  Intake              956 ml  Output             5700 ml  Net            -4744 ml    LABS: Basic Metabolic Panel:  Recent Labs  21/97/58 2140 12/09/15 0250 12/09/15 0514  NA 137  --  137  K 4.0  --  3.7  CL 100*  --  98*  CO2 28  --  29  GLUCOSE 108*  --  141*  BUN 16  --  15  CREATININE 1.45* 1.52* 1.44*  CALCIUM 8.8*  --  8.6*  MG 1.5*  --   --   PHOS 3.0  --   --    Liver Function Tests: No results for input(s): AST, ALT, ALKPHOS, BILITOT, PROT, ALBUMIN in the last 72 hours. No results for input(s): LIPASE, AMYLASE in the last 72 hours. CBC:  Recent Labs  12/08/15 2140 12/09/15 0250 12/09/15 0514  WBC 12.8* 13.2* 12.2*  NEUTROABS 10.7*  --   --   HGB 13.3 12.8* 12.9*  HCT 40.9 39.3 38.9*  MCV 77.0* 76.6* 76.1*  PLT 339 326 305   Cardiac Enzymes:  Recent Labs  12/09/15 0250 12/09/15 0843 12/09/15 1525    TROPONINI 0.06* 0.06* 0.04*   BNP: Invalid input(s): POCBNP D-Dimer: No results for input(s): DDIMER in the last 72 hours. Hemoglobin A1C: No results for input(s): HGBA1C in the last 72 hours. Fasting Lipid Panel: No results for input(s): CHOL, HDL, LDLCALC, TRIG, CHOLHDL, LDLDIRECT in the last 72 hours. Thyroid Function Tests:  Recent Labs  12/09/15 0250  TSH 2.665   Anemia Panel: No results for input(s): VITAMINB12, FOLATE, FERRITIN, TIBC, IRON, RETICCTPCT in the last 72 hours.  RADIOLOGY: Dg Chest 2 View  Result Date: 12/08/2015 CLINICAL DATA:  Shortness of breath and leg swelling. EXAM: CHEST  2 VIEW COMPARISON:  None. FINDINGS: Cardiac silhouette is enlarged. Enlargement of the central vascular structures. No large pleural effusions. Trachea is midline. Bony thorax is intact. No focal airspace disease. IMPRESSION: Cardiomegaly with vascular congestion. Electronically Signed   By: Meriel Pica.D.  On: 12/08/2015 21:44   Ct Angio Chest Pe W And/or Wo Contrast  Result Date: 12/09/2015 CLINICAL DATA:  Shortness of breath for 1 year. Tachycardia. Increased swelling in the legs. Difficulty breathing. Fever. EXAM: CT ANGIOGRAPHY CHEST WITH CONTRAST TECHNIQUE: Multidetector CT imaging of the chest was performed using the standard protocol during bolus administration of intravenous contrast. Multiplanar CT image reconstructions and MIPs were obtained to evaluate the vascular anatomy. CONTRAST:  100 mL Isovue 370 COMPARISON:  None. FINDINGS: Technically adequate study with moderately good opacification of the central and proximal segmental pulmonary arteries. More peripheral pulmonary arteries are not well opacified and remain indeterminate. Visualized pulmonary arteries demonstrate no significant central pulmonary embolus. Diffuse cardiac enlargement. Enlarged lymph nodes throughout the mediastinum are nonspecific but probably reactive. Esophagus is decompressed. Evaluation of lungs is  limited due to respiratory motion artifact. There is a diffuse mosaic attenuation pattern to the lungs which may represent motion artifact, air trapping, or edema. Airways are patent. No pleural effusions. No pneumothorax. Included portions of the upper abdominal organs are grossly unremarkable. Degenerative changes in the thoracic spine. Review of the MIP images confirms the above findings. IMPRESSION: No large central pulmonary embolus demonstrated. Cardiac enlargement with mosaic attenuation pattern in the lungs possibly due to edema, air trapping, or motion artifact. Mild mediastinal lymphadenopathy is probably reactive although nonspecific. Electronically Signed   By: Burman NievesWilliam  Stevens M.D.   On: 12/09/2015 00:26    PHYSICAL EXAM General: NAD Neck: Thick, JVP 14 cm, no thyromegaly or thyroid nodule.  Lungs: Clear to auscultation bilaterally with normal respiratory effort. CV: Nondisplaced PMI.  Heart mildly tachy, regular S1/S2, +S3, no murmur.  1+ ankle edema.   Abdomen: Soft, nontender, no hepatosplenomegaly, no distention.  Neurologic: Alert and oriented x 3.  Psych: Normal affect. Extremities: No clubbing or cyanosis.   TELEMETRY: Reviewed telemetry pt in ST in 100s  ASSESSMENT AND PLAN: 35 yo with acute systolic CHF and history of HTN.  1. Acute systolic CHF: Suspect nonischemic.  Has family history of cardiomyopathy, father had heart transplant.  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  He remains volume overloaded on exam with tachycardia and S3. SBP in 130s.  Good diuresis.  - Needs PICC placed today for CVP and co-ox.  - Continue Lasix 60 mg IV bid.  Needs BMET today.  - Continue losartan today, will transition to Entresto 24/26 bid tomorrow.  - Continue digoxin and spironolactone.  - No beta blocker yet, ?low output.  - He will need formal RHC/LHC after more diuresis.  - Too large for cMRI.  2. HTN: SBP 130s on current regimen.  3. Suspect OSA: will need sleep study as outpatient.     Marca AnconaDalton Gorman Safi 12/10/2015 10:55 AM

## 2015-12-10 NOTE — Progress Notes (Signed)
Spoke to someone from IV team to remind them that patient is on the list for a PICC and the MD was asking for it to be placed today.

## 2015-12-10 NOTE — Progress Notes (Signed)
Peripherally Inserted Central Catheter/Midline Placement  The IV Nurse has discussed with the patient and/or persons authorized to consent for the patient, the purpose of this procedure and the potential benefits and risks involved with this procedure.  The benefits include less needle sticks, lab draws from the catheter and patient may be discharged home with the catheter.  Risks include, but not limited to, infection, bleeding, blood clot (thrombus formation), and puncture of an artery; nerve damage and irregular heat beat.  Alternatives to this procedure were also discussed.  PICC/Midline Placement Documentation  PICC Double Lumen 12/10/15 PICC Right Cephalic 44 cm 0 cm (Active)  Indication for Insertion or Continuance of Line Chronic illness with exacerbations (CF, Sickle Cell, etc.) 12/10/2015  1:04 PM  Exposed Catheter (cm) 0 cm 12/10/2015  1:04 PM  Site Assessment Clean;Dry;Intact 12/10/2015  1:04 PM  Lumen #1 Status Flushed;Saline locked;Blood return noted 12/10/2015  1:04 PM  Lumen #2 Status Flushed;Saline locked;Blood return noted 12/10/2015  1:04 PM  Dressing Type Transparent 12/10/2015  1:04 PM  Dressing Status Clean;Dry;Intact 12/10/2015  1:04 PM  Dressing Change Due 12/17/15 12/10/2015  1:04 PM       Ethelda Chick 12/10/2015, 1:05 PM

## 2015-12-11 ENCOUNTER — Inpatient Hospital Stay (HOSPITAL_COMMUNITY): Payer: BC Managed Care – PPO

## 2015-12-11 LAB — BASIC METABOLIC PANEL
Anion gap: 9 (ref 5–15)
BUN: 13 mg/dL (ref 6–20)
CO2: 35 mmol/L — ABNORMAL HIGH (ref 22–32)
CREATININE: 1.32 mg/dL — AB (ref 0.61–1.24)
Calcium: 8.9 mg/dL (ref 8.9–10.3)
Chloride: 93 mmol/L — ABNORMAL LOW (ref 101–111)
GFR calc Af Amer: >60 mL/min (ref >60)
Glucose, Bld: 140 mg/dL — ABNORMAL HIGH (ref 65–99)
Potassium: 3.7 mmol/L (ref 3.5–5.1)
SODIUM: 137 mmol/L (ref 135–145)

## 2015-12-11 LAB — MAGNESIUM: MAGNESIUM: 1.9 mg/dL (ref 1.7–2.4)

## 2015-12-11 MED ORDER — SPIRONOLACTONE 25 MG PO TABS
25.0000 mg | ORAL_TABLET | Freq: Every day | ORAL | Status: DC
Start: 2015-12-11 — End: 2015-12-14
  Administered 2015-12-11 – 2015-12-14 (×4): 25 mg via ORAL
  Filled 2015-12-11 (×4): qty 1

## 2015-12-11 MED ORDER — SODIUM CHLORIDE 0.9% FLUSH: 10.0000 mL | Freq: Two times a day (BID) | INTRAVENOUS | Status: DC

## 2015-12-11 MED ORDER — MAGNESIUM SULFATE IN D5W 1-5 GM/100ML-% IV SOLN
1.0000 g | Freq: Once | INTRAVENOUS | Status: AC
  Administered 2015-12-11: 1 g via INTRAVENOUS
  Filled 2015-12-11: qty 100

## 2015-12-11 MED ORDER — POTASSIUM CHLORIDE CRYS ER 20 MEQ PO TBCR
40.0000 meq | EXTENDED_RELEASE_TABLET | Freq: Every day | ORAL | Status: DC
  Administered 2015-12-11 – 2015-12-14 (×4): 40 meq via ORAL
  Filled 2015-12-11 (×4): qty 2

## 2015-12-11 MED ORDER — SODIUM CHLORIDE 0.9% FLUSH: 10.0000 mL | INTRAVENOUS | Status: DC | PRN

## 2015-12-11 NOTE — Progress Notes (Signed)
Patient ID: Antonio Morgan, male   DOB: [birth year], 35 y.o.   MRN: [id number]   SUBJECTIVE: Good diuresis yesterday.  Weight down, breathing improving remains mildly tachycardic. Marland Kitchen   Scheduled Meds: . digoxin  0.25 mg Oral Daily  . doxycycline  100 mg Oral Q12H  . enoxaparin (LOVENOX) injection  80 mg Subcutaneous Q24H  . furosemide  60 mg Intravenous Q12H  . magnesium sulfate 1 - 4 g bolus IVPB  1 g Intravenous Once  . potassium chloride  40 mEq Oral Daily  . sacubitril-valsartan  1 tablet Oral BID  . sodium chloride flush  10-40 mL Intracatheter Q12H  . spironolactone  25 mg Oral Daily   Continuous Infusions:  PRN Meds:.acetaminophen **OR** acetaminophen, colchicine, ondansetron **OR** ondansetron (ZOFRAN) IV, sodium chloride flush    Vitals:   12/10/15 1942 12/10/15 2300 12/10/15 2348 12/11/15 0300  BP: 127/84  (!) 145/83 (!) 150/95  Pulse: (!) 107 (!) 104 (!) 107 (!) 108  Resp: 17 (!) 21 19 (!) 22  Temp: 98.4 F (36.9 C)  (!) 100.4 F (38 C) (!) 100.8 F (38.2 C)  TempSrc: Oral  Oral Oral  SpO2: 97% 98% 98% 100%  Weight:    (!) 366 lb 10 oz (166.3 kg)  Height:        Intake/Output Summary (Last 24 hours) at 12/11/15 0818 Last data filed at 12/11/15 0600  Gross per 24 hour  Intake             1690 ml  Output             7126 ml  Net            -5436 ml    LABS: Basic Metabolic Panel:  Recent Labs  38/32/91 2140  12/10/15 1114 12/11/15 0305  NA 137  < > 135 137  K 4.0  < > 3.5 3.7  CL 100*  < > 92* 93*  CO2 28  < > 36* 35*  GLUCOSE 108*  < > 112* 140*  BUN 16  < > 11 13  CREATININE 1.45*  < > 1.29* 1.32*  CALCIUM 8.8*  < > 8.6* 8.9  MG 1.5*  --   --  1.9  PHOS 3.0  --   --   --   < > = values in this interval not displayed. Liver Function Tests: No results for input(s): AST, ALT, ALKPHOS, BILITOT, PROT, ALBUMIN in the last 72 hours. No results for input(s): LIPASE, AMYLASE in the last 72 hours. CBC:  Recent Labs  12/08/15 2140 12/09/15 0250  12/09/15 0514  WBC 12.8* 13.2* 12.2*  NEUTROABS 10.7*  --   --   HGB 13.3 12.8* 12.9*  HCT 40.9 39.3 38.9*  MCV 77.0* 76.6* 76.1*  PLT 339 326 305   Cardiac Enzymes:  Recent Labs  12/09/15 0250 12/09/15 0843 12/09/15 1525  TROPONINI 0.06* 0.06* 0.04*   BNP: Invalid input(s): POCBNP D-Dimer: No results for input(s): DDIMER in the last 72 hours. Hemoglobin A1C: No results for input(s): HGBA1C in the last 72 hours. Fasting Lipid Panel: No results for input(s): CHOL, HDL, LDLCALC, TRIG, CHOLHDL, LDLDIRECT in the last 72 hours. Thyroid Function Tests:  Recent Labs  12/09/15 0250  TSH 2.665   Anemia Panel: No results for input(s): VITAMINB12, FOLATE, FERRITIN, TIBC, IRON, RETICCTPCT in the last 72 hours.  RADIOLOGY: Dg Chest 2 View  Result Date: 12/08/2015 CLINICAL DATA:  Shortness of breath and leg swelling. EXAM: CHEST  2 VIEW COMPARISON:  None. FINDINGS: Cardiac silhouette is enlarged. Enlargement of the central vascular structures. No large pleural effusions. Trachea is midline. Bony thorax is intact. No focal airspace disease. IMPRESSION: Cardiomegaly with vascular congestion. Electronically Signed   By: Richarda OverlieAdam  Henn M.D.   On: 12/08/2015 21:44   Ct Angio Chest Pe W And/or Wo Contrast  Result Date: 12/09/2015 CLINICAL DATA:  Shortness of breath for 1 year. Tachycardia. Increased swelling in the legs. Difficulty breathing. Fever. EXAM: CT ANGIOGRAPHY CHEST WITH CONTRAST TECHNIQUE: Multidetector CT imaging of the chest was performed using the standard protocol during bolus administration of intravenous contrast. Multiplanar CT image reconstructions and MIPs were obtained to evaluate the vascular anatomy. CONTRAST:  100 mL Isovue 370 COMPARISON:  None. FINDINGS: Technically adequate study with moderately good opacification of the central and proximal segmental pulmonary arteries. More peripheral pulmonary arteries are not well opacified and remain indeterminate. Visualized  pulmonary arteries demonstrate no significant central pulmonary embolus. Diffuse cardiac enlargement. Enlarged lymph nodes throughout the mediastinum are nonspecific but probably reactive. Esophagus is decompressed. Evaluation of lungs is limited due to respiratory motion artifact. There is a diffuse mosaic attenuation pattern to the lungs which may represent motion artifact, air trapping, or edema. Airways are patent. No pleural effusions. No pneumothorax. Included portions of the upper abdominal organs are grossly unremarkable. Degenerative changes in the thoracic spine. Review of the MIP images confirms the above findings. IMPRESSION: No large central pulmonary embolus demonstrated. Cardiac enlargement with mosaic attenuation pattern in the lungs possibly due to edema, air trapping, or motion artifact. Mild mediastinal lymphadenopathy is probably reactive although nonspecific. Electronically Signed   By: Burman NievesWilliam  Stevens M.D.   On: 12/09/2015 00:26    PHYSICAL EXAM General: NAD Neck: Thick, JVP 14 cm, no thyromegaly or thyroid nodule.  Lungs: Clear to auscultation bilaterally with normal respiratory effort. CV: Nondisplaced PMI.  Heart mildly tachy, regular S1/S2, +S3, no murmur.  1+ ankle edema.   Abdomen: Soft, nontender, no hepatosplenomegaly, no distention.  Neurologic: Alert and oriented x 3.  Psych: Normal affect. Extremities: No clubbing or cyanosis.   TELEMETRY: Reviewed telemetry pt in ST in 100s  ASSESSMENT AND PLAN: 35 yo with acute systolic CHF and history of HTN.  1. Acute systolic CHF: Suspect nonischemic.  Has family history of cardiomyopathy, father had heart transplant.  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  He remains volume overloaded on exam with tachycardia and S3. SBP in 140s-150s.  Good diuresis. Still volume overloaded, CVP 18-20 this morning.  Still waiting for co-ox to be done.  - Needs co-ox to be done.  - Continue Lasix 60 mg IV bid, good diuresis so far but needs more.    Sherryll Burger- Entresto to start this morning.  - Increase spironolactone to 25 mg daily.   - Continue digoxin.  - If BP stable on Entresto, start Bidil tomorrow.  - No beta blocker yet, ?low output.  - He will need formal RHC/LHC after more diuresis.  - Too large for cMRI.  2. HTN: SBP 140s on current regimen, Entresto to start today.   3. Suspect OSA: will need sleep study as outpatient.   Marca AnconaDalton Zyheir Daft 12/11/2015 8:18 AM

## 2015-12-11 NOTE — Progress Notes (Signed)
Offered Pt a bath, Pt and family member stated that they will handle bath. Tech gave Pt all bath supplies.

## 2015-12-12 DIAGNOSIS — I5041 Acute combined systolic (congestive) and diastolic (congestive) heart failure: Secondary | ICD-10-CM

## 2015-12-12 LAB — CARBOXYHEMOGLOBIN
CARBOXYHEMOGLOBIN: 1.3 % (ref 0.5–1.5)
Carboxyhemoglobin: 1.3 % (ref 0.5–1.5)
Methemoglobin: 0.9 % (ref 0.0–1.5)
Methemoglobin: 0.9 % (ref 0.0–1.5)
O2 SAT: 67.9 %
O2 SAT: 70.5 %
TOTAL HEMOGLOBIN: 14.1 g/dL (ref 13.5–18.0)
Total hemoglobin: 13.2 g/dL — ABNORMAL LOW (ref 13.5–18.0)

## 2015-12-12 LAB — CBC
HCT: 44.3 % (ref 39.0–52.0)
Hemoglobin: 13.7 g/dL (ref 13.0–17.0)
MCH: 25 pg — AB (ref 26.0–34.0)
MCHC: 30.9 g/dL (ref 30.0–36.0)
MCV: 80.7 fL (ref 78.0–100.0)
PLATELETS: 320 10*3/uL (ref 150–400)
RBC: 5.49 MIL/uL (ref 4.22–5.81)
RDW: 16.2 % — AB (ref 11.5–15.5)
WBC: 12.6 10*3/uL — ABNORMAL HIGH (ref 4.0–10.5)

## 2015-12-12 LAB — BASIC METABOLIC PANEL
ANION GAP: 8 (ref 5–15)
BUN: 12 mg/dL (ref 6–20)
CALCIUM: 8.8 mg/dL — AB (ref 8.9–10.3)
CHLORIDE: 94 mmol/L — AB (ref 101–111)
CO2: 37 mmol/L — ABNORMAL HIGH (ref 22–32)
CREATININE: 1.18 mg/dL (ref 0.61–1.24)
GFR calc non Af Amer: >60 mL/min (ref >60)
Glucose, Bld: 142 mg/dL — ABNORMAL HIGH (ref 65–99)
Potassium: 3.8 mmol/L (ref 3.5–5.1)
SODIUM: 139 mmol/L (ref 135–145)

## 2015-12-12 MED ORDER — SACUBITRIL-VALSARTAN 49-51 MG PO TABS
1.0000 | ORAL_TABLET | Freq: Two times a day (BID) | ORAL | Status: DC
  Administered 2015-12-12 – 2015-12-13 (×3): 1 via ORAL
  Filled 2015-12-12 (×4): qty 1

## 2015-12-12 NOTE — Progress Notes (Signed)
CARDIAC REHAB PHASE I   PRE:  Rate/Rhythm: 111 ST  BP:  Sitting: 173/77        SaO2: 100 4L, 90 RA  MODE:  Ambulation: 550 ft   POST:  Rate/Rhythm: 120 ST  BP:  Sitting: 114/99         SaO2: 85 RA, 87 2L, 90-91 3L, 95% on 3L at rest  Pt ambulated 550 ft on RA-3L O2, IV, assist x1, steady gait, tolerated well. Pt c/o mild DOE, denies any other complaints, declined rest stop. Pt sats 85% on RA with ambulation, 87 % on 2L, 90-91% on 3L O2 with ambulation. Left CHF booklet and zone tool, and heart healthy and low sodium diet handouts for pt to review. Pt verbalized understanding. Pt to recliner after walk, call bell within reach. Pt appreciate of walk. Encouraged additional ambulation today with staff. Will follow.   SATURATION QUALIFICATIONS: (This note is used to comply with regulatory documentation for home oxygen)  Patient Saturations on Room Air at Rest = 90%  Patient Saturations on Room Air while Ambulating = 85%  Patient Saturations on 3 Liters of oxygen while Ambulating = 90-91%  Please briefly explain why patient needs home oxygen: Pt requires oxygen at this time to maintain O2 sats with ambulation.  [id number] Joylene Grapes, RN, BSN 12/12/2015 10:32 AM

## 2015-12-12 NOTE — Progress Notes (Signed)
Spoke w pt. He was given 30day free entresto card and 10.00 per month copay card. 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 Home o2 arranged w [facility] and port o2 already del to room. Conc will be del after pt gets home.

## 2015-12-12 NOTE — Progress Notes (Signed)
Patient ID: Antonio RiggsJason E Morgan, male   DOB: 02/02/1981, 35 y.o.   MRN: [id number]   SUBJECTIVE:   Yesterday entresto started and spiro was increased. Todays CO-OX is 70%. Brisk diuresis noted. Weight down another 9 pounds. (almost 30 pounds total).   Overall feeling ok. Denies SOB. Ambulating.     .   Scheduled Meds: . digoxin  0.25 mg Oral Daily  . doxycycline  100 mg Oral Q12H  . enoxaparin (LOVENOX) injection  80 mg Subcutaneous Q24H  . furosemide  60 mg Intravenous Q12H  . potassium chloride  40 mEq Oral Daily  . sacubitril-valsartan  1 tablet Oral BID  . sodium chloride flush  10-40 mL Intracatheter Q12H  . spironolactone  25 mg Oral Daily   Continuous Infusions:  PRN Meds:.acetaminophen **OR** acetaminophen, colchicine, ondansetron **OR** ondansetron (ZOFRAN) IV, sodium chloride flush    Vitals:   12/12/15 0536 12/12/15 0600 12/12/15 0700 12/12/15 0821  BP:      Pulse:      Resp: 17 20 19 20   Temp:    98.1 F (36.7 C)  TempSrc:    Oral  SpO2: (!) 87% (!) 83% 100%   Weight:      Height:        Intake/Output Summary (Last 24 hours) at 12/12/15 0845 Last data filed at 12/12/15 0254  Gross per 24 hour  Intake              840 ml  Output             4702 ml  Net            -3862 ml    LABS: Basic Metabolic Panel:  Recent Labs  04/24/7206/20/17 0305 12/12/15 0314  NA 137 139  K 3.7 3.8  CL 93* 94*  CO2 35* 37*  GLUCOSE 140* 142*  BUN 13 12  CREATININE 1.32* 1.18  CALCIUM 8.9 8.8*  MG 1.9  --    Liver Function Tests: No results for input(s): AST, ALT, ALKPHOS, BILITOT, PROT, ALBUMIN in the last 72 hours. No results for input(s): LIPASE, AMYLASE in the last 72 hours. CBC:  Recent Labs  12/12/15 0314  WBC 12.6*  HGB 13.7  HCT 44.3  MCV 80.7  PLT 320   Cardiac Enzymes:  Recent Labs  12/09/15 1525  TROPONINI 0.04*   BNP: Invalid input(s): POCBNP D-Dimer: No results for input(s): DDIMER in the last 72 hours. Hemoglobin A1C: No results for  input(s): HGBA1C in the last 72 hours. Fasting Lipid Panel: No results for input(s): CHOL, HDL, LDLCALC, TRIG, CHOLHDL, LDLDIRECT in the last 72 hours. Thyroid Function Tests: No results for input(s): TSH, T4TOTAL, T3FREE, THYROIDAB in the last 72 hours.  Invalid input(s): FREET3 Anemia Panel: No results for input(s): VITAMINB12, FOLATE, FERRITIN, TIBC, IRON, RETICCTPCT in the last 72 hours.  RADIOLOGY: Dg Chest 2 View  Result Date: 12/11/2015 CLINICAL DATA:  Pneumonia EXAM: CHEST  2 VIEW COMPARISON:  12/08/2015 FINDINGS: Cardiomegaly again noted. No acute infiltrate or pleural effusion. No pulmonary edema. Degenerative changes thoracic spine. IMPRESSION: Cardiomegaly again noted. No active disease. Degenerative changes thoracic spine. Electronically Signed   By: Natasha MeadLiviu  Pop M.D.   On: 12/11/2015 10:26   Dg Chest 2 View  Result Date: 12/08/2015 CLINICAL DATA:  Shortness of breath and leg swelling. EXAM: CHEST  2 VIEW COMPARISON:  None. FINDINGS: Cardiac silhouette is enlarged. Enlargement of the central vascular structures. No large pleural effusions. Trachea is midline. Bony thorax is  intact. No focal airspace disease. IMPRESSION: Cardiomegaly with vascular congestion. Electronically Signed   By: Richarda Overlie M.D.   On: 12/08/2015 21:44   Ct Angio Chest Pe W And/or Wo Contrast  Result Date: 12/09/2015 CLINICAL DATA:  Shortness of breath for 1 year. Tachycardia. Increased swelling in the legs. Difficulty breathing. Fever. EXAM: CT ANGIOGRAPHY CHEST WITH CONTRAST TECHNIQUE: Multidetector CT imaging of the chest was performed using the standard protocol during bolus administration of intravenous contrast. Multiplanar CT image reconstructions and MIPs were obtained to evaluate the vascular anatomy. CONTRAST:  100 mL Isovue 370 COMPARISON:  None. FINDINGS: Technically adequate study with moderately good opacification of the central and proximal segmental pulmonary arteries. More peripheral pulmonary  arteries are not well opacified and remain indeterminate. Visualized pulmonary arteries demonstrate no significant central pulmonary embolus. Diffuse cardiac enlargement. Enlarged lymph nodes throughout the mediastinum are nonspecific but probably reactive. Esophagus is decompressed. Evaluation of lungs is limited due to respiratory motion artifact. There is a diffuse mosaic attenuation pattern to the lungs which may represent motion artifact, air trapping, or edema. Airways are patent. No pleural effusions. No pneumothorax. Included portions of the upper abdominal organs are grossly unremarkable. Degenerative changes in the thoracic spine. Review of the MIP images confirms the above findings. IMPRESSION: No large central pulmonary embolus demonstrated. Cardiac enlargement with mosaic attenuation pattern in the lungs possibly due to edema, air trapping, or motion artifact. Mild mediastinal lymphadenopathy is probably reactive although nonspecific. Electronically Signed   By: Burman Nieves M.D.   On: 12/09/2015 00:26    PHYSICAL EXAM CVP 8-9 General: NAD. In bed.  Neck: Thick, JVP 9  no thyromegaly or thyroid nodule.  Lungs: Clear to auscultation bilaterally with normal respiratory effort. CV: Nondisplaced PMI.  Heart mildly tachy, regular S1/S2, +S3, no murmur. 1+ ankle edema.   Abdomen: Soft, nontender, no hepatosplenomegaly, no distention.  Neurologic: Alert and oriented x 3.  Psych: Normal affect. Extremities: No clubbing or cyanosis.   TELEMETRY: Reviewed telemetry pt in ST in 100s  ASSESSMENT AND PLAN: 35 yo with acute systolic CHF and history of HTN.  1. Acute systolic CHF: Suspect nonischemic.  Has family history of cardiomyopathy, father had heart transplant.  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   Todays CO-OX is 70%. Renal function stable. Brisk diuresis noted. Continue IV lasix. On spiro 25 mg daily.   -Increase entresto to 49-51 mg twice a day.   - Continue spironolactone to 25 mg  daily.   - Continue digoxin.  - No beta blocker yet, ?low output.  - He will need formal RHC/LHC after more diuresis.  - Too large for cMRI.  2. HTN: SBP 120-130s current regimen, Entresto increased  today.   3. Suspect OSA: will need sleep study as outpatient.   Consult cardiac rehab-  Oxygen Saturations Patient Saturations on Room Air at Rest = 90% Patient Saturations on Room Air while Ambulating = 85% Patient Saturations on 3 Liters of oxygen while Ambulating = 90-91% Will need oxygen with ambulation. --order placed.    Amy Clegg NP-C  12/12/2015 8:45 AM   Patient seen and examined with Tonye Becket, NP. We discussed all aspects of the encounter. I agree with the assessment and plan as stated above.   Weight down almost 30 pounds. Breathing better. Co-ox looks good. Renal function stable. CVP 8-9 range. BP improved. Agree with increasing Entresto. Continue spiro and digoxin. No b-blocker yet.   Continue IV diuresis one more day. Plan R/L cath likely Wednesday.  Ryenn Howeth,MD 10:14 PM

## 2015-12-12 NOTE — Progress Notes (Signed)
Patient has been up to chair for most of the day. Doing well, no complaint of pain. Patient has a good appetite, ambulates in room unassisted. Has walked in the hallway today as well as ambulating in his room. Significant other is at the bedside. Family has been in and out visiting today. No questions or concerns, call bell in reach, will continue to monitor.

## 2015-12-13 ENCOUNTER — Ambulatory Visit (HOSPITAL_COMMUNITY): Admit: 2015-12-13 | Payer: Self-pay | Admitting: Internal Medicine

## 2015-12-13 ENCOUNTER — Encounter (HOSPITAL_COMMUNITY): Payer: Self-pay | Admitting: Internal Medicine

## 2015-12-13 ENCOUNTER — Encounter (HOSPITAL_COMMUNITY)
Admission: EM | Disposition: A | Discharge status: Home / Self Care | Payer: Self-pay | Source: Home / Self Care | Attending: Internal Medicine

## 2015-12-13 DIAGNOSIS — I2510 Atherosclerotic heart disease of native coronary artery without angina pectoris: Secondary | ICD-10-CM

## 2015-12-13 HISTORY — PX: CARDIAC CATHETERIZATION: SHX172

## 2015-12-13 LAB — POCT I-STAT 3, VENOUS BLOOD GAS (G3P V)
ACID-BASE EXCESS: 11 mmol/L — AB (ref 0.0–2.0)
Acid-Base Excess: 12 mmol/L — ABNORMAL HIGH (ref 0.0–2.0)
BICARBONATE: 37.3 meq/L — AB (ref 20.0–24.0)
Bicarbonate: 38.6 mEq/L — ABNORMAL HIGH (ref 20.0–24.0)
O2 SAT: 67 %
O2 SAT: 69 %
PH VEN: 7.452 — AB (ref 7.250–7.300)
PO2 VEN: 35 mmHg (ref 31.0–45.0)
TCO2: 39 mmol/L (ref 0–100)
TCO2: 40 mmol/L (ref 0–100)
pCO2, Ven: 53.1 mmHg — ABNORMAL HIGH (ref 45.0–50.0)
pCO2, Ven: 55.3 mmHg — ABNORMAL HIGH (ref 45.0–50.0)
pH, Ven: 7.455 — ABNORMAL HIGH (ref 7.250–7.300)
pO2, Ven: 35 mmHg (ref 31.0–45.0)

## 2015-12-13 LAB — BASIC METABOLIC PANEL
Anion gap: 7 (ref 5–15)
BUN: 11 mg/dL (ref 6–20)
CO2: 38 mmol/L — ABNORMAL HIGH (ref 22–32)
CREATININE: 1.03 mg/dL (ref 0.61–1.24)
Calcium: 8.9 mg/dL (ref 8.9–10.3)
Chloride: 95 mmol/L — ABNORMAL LOW (ref 101–111)
Glucose, Bld: 115 mg/dL — ABNORMAL HIGH (ref 65–99)
Potassium: 4.3 mmol/L (ref 3.5–5.1)
SODIUM: 140 mmol/L (ref 135–145)

## 2015-12-13 LAB — POCT I-STAT 3, ART BLOOD GAS (G3+)
ACID-BASE EXCESS: 10 mmol/L — AB (ref 0.0–2.0)
ACID-BASE EXCESS: 11 mmol/L — AB (ref 0.0–2.0)
Bicarbonate: 37.1 mEq/L — ABNORMAL HIGH (ref 20.0–24.0)
Bicarbonate: 37.8 mEq/L — ABNORMAL HIGH (ref 20.0–24.0)
O2 SAT: 74 %
O2 SAT: 94 %
PCO2 ART: 57.8 mmHg — AB (ref 35.0–45.0)
PO2 ART: 70 mmHg — AB (ref 80.0–100.0)
TCO2: 39 mmol/L (ref 0–100)
TCO2: 39 mmol/L (ref 0–100)
pCO2 arterial: 54.2 mmHg — ABNORMAL HIGH (ref 35.0–45.0)
pH, Arterial: 7.415 (ref 7.350–7.450)
pH, Arterial: 7.452 — ABNORMAL HIGH (ref 7.350–7.450)
pO2, Arterial: 40 mmHg — ABNORMAL LOW (ref 80.0–100.0)

## 2015-12-13 LAB — CARBOXYHEMOGLOBIN
Carboxyhemoglobin: 1.6 % — ABNORMAL HIGH (ref 0.5–1.5)
Methemoglobin: 0.8 % (ref 0.0–1.5)
O2 Saturation: 73.5 %
TOTAL HEMOGLOBIN: 14.9 g/dL (ref 13.5–18.0)

## 2015-12-13 LAB — CBC
HEMATOCRIT: 44.3 % (ref 39.0–52.0)
Hemoglobin: 13.9 g/dL (ref 13.0–17.0)
MCH: 25.1 pg — ABNORMAL LOW (ref 26.0–34.0)
MCHC: 31.4 g/dL (ref 30.0–36.0)
MCV: 80.1 fL (ref 78.0–100.0)
Platelets: 281 10*3/uL (ref 150–400)
RBC: 5.53 MIL/uL (ref 4.22–5.81)
RDW: 16 % — AB (ref 11.5–15.5)
WBC: 11.6 10*3/uL — ABNORMAL HIGH (ref 4.0–10.5)

## 2015-12-13 LAB — HEPATITIS PANEL, ACUTE
HEP A IGM: NEGATIVE
HEP B C IGM: NEGATIVE
Hepatitis B Surface Ag: NEGATIVE

## 2015-12-13 LAB — PROTIME-INR
INR: 1.18
Prothrombin Time: 15.1 seconds (ref 11.4–15.2)

## 2015-12-13 LAB — CULTURE, BLOOD (ROUTINE X 2)
CULTURE: NO GROWTH
Culture: NO GROWTH

## 2015-12-13 LAB — CREATININE, SERUM
Creatinine, Ser: 0.98 mg/dL (ref 0.61–1.24)
GFR calc Af Amer: >60 mL/min (ref >60)
GFR calc non Af Amer: >60 mL/min (ref >60)

## 2015-12-13 LAB — DIGOXIN LEVEL: Digoxin Level: 0.3 ng/mL — ABNORMAL LOW (ref 0.8–2.0)

## 2015-12-13 SURGERY — RIGHT/LEFT HEART CATH AND CORONARY ANGIOGRAPHY

## 2015-12-13 MED ORDER — SODIUM CHLORIDE 0.9% FLUSH: 3.0000 mL | INTRAVENOUS | Status: DC | PRN

## 2015-12-13 MED ORDER — LIDOCAINE HCL (PF) 1 % IJ SOLN
INTRAMUSCULAR | Status: AC
  Filled 2015-12-13: qty 30

## 2015-12-13 MED ORDER — FENTANYL CITRATE (PF) 100 MCG/2ML IJ SOLN
INTRAMUSCULAR | Status: AC
  Filled 2015-12-13: qty 2

## 2015-12-13 MED ORDER — HEPARIN (PORCINE) IN NACL 2-0.9 UNIT/ML-% IJ SOLN
INTRAMUSCULAR | Status: DC | PRN
  Administered 2015-12-13: 1500 mL

## 2015-12-13 MED ORDER — SODIUM CHLORIDE 0.9% FLUSH
3.0000 mL | Freq: Two times a day (BID) | INTRAVENOUS | Status: DC
  Administered 2015-12-13 – 2015-12-14 (×2): 3 mL via INTRAVENOUS

## 2015-12-13 MED ORDER — SODIUM CHLORIDE 0.9% FLUSH: 3.0000 mL | Freq: Two times a day (BID) | INTRAVENOUS | Status: DC

## 2015-12-13 MED ORDER — VERAPAMIL HCL 2.5 MG/ML IV SOLN
INTRAVENOUS | Status: DC | PRN
  Administered 2015-12-13 (×2): 10 mL via INTRA_ARTERIAL

## 2015-12-13 MED ORDER — FUROSEMIDE 40 MG PO TABS
40.0000 mg | ORAL_TABLET | Freq: Two times a day (BID) | ORAL | Status: DC
  Administered 2015-12-13 – 2015-12-14 (×2): 40 mg via ORAL
  Filled 2015-12-13 (×2): qty 1

## 2015-12-13 MED ORDER — ONDANSETRON HCL 4 MG/2ML IJ SOLN: 4.0000 mg | Freq: Four times a day (QID) | INTRAMUSCULAR | Status: DC | PRN

## 2015-12-13 MED ORDER — HEPARIN (PORCINE) IN NACL 2-0.9 UNIT/ML-% IJ SOLN
INTRAMUSCULAR | Status: AC
  Filled 2015-12-13: qty 1500

## 2015-12-13 MED ORDER — VERAPAMIL HCL 2.5 MG/ML IV SOLN
INTRAVENOUS | Status: AC
  Filled 2015-12-13: qty 2

## 2015-12-13 MED ORDER — SODIUM CHLORIDE 0.9 % IV SOLN
INTRAVENOUS | Status: DC | PRN
  Administered 2015-12-13: 10 mL/h via INTRAVENOUS

## 2015-12-13 MED ORDER — SODIUM CHLORIDE 0.9 % IV SOLN: INTRAVENOUS | Status: DC

## 2015-12-13 MED ORDER — SODIUM CHLORIDE 0.9 % IV SOLN
INTRAVENOUS | Status: AC
  Administered 2015-12-13: 14:00:00 via INTRAVENOUS

## 2015-12-13 MED ORDER — LIDOCAINE HCL (PF) 1 % IJ SOLN
INTRAMUSCULAR | Status: DC | PRN
  Administered 2015-12-13 (×2): 2 mL via SUBCUTANEOUS

## 2015-12-13 MED ORDER — HEPARIN SODIUM (PORCINE) 1000 UNIT/ML IJ SOLN
INTRAMUSCULAR | Status: AC
  Filled 2015-12-13: qty 1

## 2015-12-13 MED ORDER — NITROGLYCERIN 1 MG/10 ML FOR IR/CATH LAB
INTRA_ARTERIAL | Status: AC
  Filled 2015-12-13: qty 10

## 2015-12-13 MED ORDER — MIDAZOLAM HCL 2 MG/2ML IJ SOLN
INTRAMUSCULAR | Status: AC
  Filled 2015-12-13: qty 2

## 2015-12-13 MED ORDER — FENTANYL CITRATE (PF) 100 MCG/2ML IJ SOLN
INTRAMUSCULAR | Status: DC | PRN
  Administered 2015-12-13: 25 ug via INTRAVENOUS

## 2015-12-13 MED ORDER — SODIUM CHLORIDE 0.9 % IV SOLN: 250.0000 mL | INTRAVENOUS | Status: DC | PRN

## 2015-12-13 MED ORDER — CARVEDILOL 3.125 MG PO TABS
3.1250 mg | ORAL_TABLET | Freq: Two times a day (BID) | ORAL | Status: DC
  Administered 2015-12-13 – 2015-12-14 (×2): 3.125 mg via ORAL
  Filled 2015-12-13 (×3): qty 1

## 2015-12-13 MED ORDER — ASPIRIN 81 MG PO CHEW
81.0000 mg | CHEWABLE_TABLET | ORAL | Status: AC
  Administered 2015-12-13: 81 mg via ORAL
  Filled 2015-12-13: qty 1

## 2015-12-13 MED ORDER — HEPARIN SODIUM (PORCINE) 1000 UNIT/ML IJ SOLN
INTRAMUSCULAR | Status: DC | PRN
  Administered 2015-12-13: 5000 [IU] via INTRAVENOUS
  Administered 2015-12-13: 2000 [IU] via INTRAVENOUS

## 2015-12-13 MED ORDER — IOPAMIDOL (ISOVUE-370) INJECTION 76%
INTRAVENOUS | Status: AC
  Filled 2015-12-13: qty 100

## 2015-12-13 MED ORDER — IOPAMIDOL (ISOVUE-370) INJECTION 76%
INTRAVENOUS | Status: DC | PRN
  Administered 2015-12-13: 120 mL via INTRA_ARTERIAL

## 2015-12-13 MED ORDER — MIDAZOLAM HCL 2 MG/2ML IJ SOLN
INTRAMUSCULAR | Status: DC | PRN
  Administered 2015-12-13: 1 mg via INTRAVENOUS
  Administered 2015-12-13: 2 mg via INTRAVENOUS
  Administered 2015-12-13: 1 mg via INTRAVENOUS

## 2015-12-13 MED ORDER — IOPAMIDOL (ISOVUE-370) INJECTION 76%
INTRAVENOUS | Status: AC
  Filled 2015-12-13: qty 50

## 2015-12-13 MED ORDER — ENOXAPARIN SODIUM 80 MG/0.8ML ~~LOC~~ SOLN
80.0000 mg | SUBCUTANEOUS | Status: DC
  Administered 2015-12-14: 80 mg via SUBCUTANEOUS
  Filled 2015-12-13: qty 0.8

## 2015-12-13 MED ORDER — ACETAMINOPHEN 325 MG PO TABS: 650.0000 mg | ORAL_TABLET | ORAL | Status: DC | PRN

## 2015-12-13 MED ORDER — NITROGLYCERIN 1 MG/10 ML FOR IR/CATH LAB
INTRA_ARTERIAL | Status: DC | PRN
  Administered 2015-12-13: 200 ug via INTRA_ARTERIAL

## 2015-12-13 SURGICAL SUPPLY — 15 items
CATH BALLN WEDGE 5F 110CM (CATHETERS) ×2 IMPLANT
CATH INFINITI 4FR JL 4.0 (CATHETERS) ×2 IMPLANT
CATH INFINITI 5FR JL4 (CATHETERS) ×2 IMPLANT
CATH INFINITI JR4 5F (CATHETERS) ×2 IMPLANT
CATH LAUNCHER 5F EBU4.0 (CATHETERS) ×2 IMPLANT
DEVICE RAD COMP TR BAND LRG (VASCULAR PRODUCTS) ×2 IMPLANT
GLIDESHEATH SLEND SS 6F .021 (SHEATH) ×4 IMPLANT
KIT HEART LEFT (KITS) ×3 IMPLANT
PACK CARDIAC CATHETERIZATION (CUSTOM PROCEDURE TRAY) ×3 IMPLANT
SHEATH FAST CATH BRACH 5F 5CM (SHEATH) ×2 IMPLANT
TRANSDUCER W/STOPCOCK (MISCELLANEOUS) ×4 IMPLANT
TUBING CIL FLEX 10 FLL-RA (TUBING) ×3 IMPLANT
WIRE EMERALD 3MM-J .025X260CM (WIRE) ×2 IMPLANT
WIRE HI TORQ VERSACORE-J 145CM (WIRE) ×2 IMPLANT
WIRE SAFE-T 1.5MM-J .035X260CM (WIRE) ×2 IMPLANT

## 2015-12-13 NOTE — Progress Notes (Signed)
CARDIAC REHAB PHASE I   Pt recently returned from cath lab, will follow-up tomorrow for ambulation/education.  Joylene Grapes, RN, BSN 12/13/2015 2:22 PM

## 2015-12-13 NOTE — Progress Notes (Signed)
Patient ID: Antonio Morgan, male   DOB: 12/28/1980, 35 y.o.   MRN: [id number]   SUBJECTIVE:   [name] was increased. Todays CO-OX is 73.5%. Brisk diuresis noted. Weight down another 5  pounds.   Says he feels great. Denies SOB.   Scheduled Meds: . digoxin  0.25 mg Oral Daily  . doxycycline  100 mg Oral Q12H  . enoxaparin (LOVENOX) injection  80 mg Subcutaneous Q24H  . furosemide  60 mg Intravenous Q12H  . potassium chloride  40 mEq Oral Daily  . sacubitril-valsartan  1 tablet Oral BID  . sodium chloride flush  10-40 mL Intracatheter Q12H  . spironolactone  25 mg Oral Daily   Continuous Infusions:  PRN Meds:.acetaminophen **OR** acetaminophen, colchicine, ondansetron **OR** ondansetron (ZOFRAN) IV, sodium chloride flush    Vitals:   12/13/15 0358 12/13/15 0400 12/13/15 0414 12/13/15 0500  BP: 135/89 135/89    Pulse: (!) 112 (!) 111 (!) 117 (!) 107  Resp:      Temp: 99.9 F (37.7 C)     TempSrc: Oral     SpO2: 100% 97% 99% 99%  Weight: (!) 159.8 kg (352 lb 6.4 oz)     Height: 6' 1.5" (1.867 m)       Intake/Output Summary (Last 24 hours) at 12/13/15 0815 Last data filed at 12/13/15 0400  Gross per 24 hour  Intake              960 ml  Output             4350 ml  Net            -3390 ml    LABS: Basic Metabolic Panel:  Recent Labs  36/46/80 0305 12/12/15 0314 12/13/15 0405  NA 137 139 140  K 3.7 3.8 4.3  CL 93* 94* 95*  CO2 35* 37* 38*  GLUCOSE 140* 142* 115*  BUN 13 12 11   CREATININE 1.32* 1.18 1.03  CALCIUM 8.9 8.8* 8.9  MG 1.9  --   --    Liver Function Tests: No results for input(s): AST, ALT, ALKPHOS, BILITOT, PROT, ALBUMIN in the last 72 hours. No results for input(s): LIPASE, AMYLASE in the last 72 hours. CBC:  Recent Labs  12/12/15 0314  WBC 12.6*  HGB 13.7  HCT 44.3  MCV 80.7  PLT 320   Cardiac Enzymes: No results for input(s): CKTOTAL, CKMB, CKMBINDEX, TROPONINI in the last 72 hours. BNP: Invalid input(s):  POCBNP D-Dimer: No results for input(s): DDIMER in the last 72 hours. Hemoglobin A1C: No results for input(s): HGBA1C in the last 72 hours. Fasting Lipid Panel: No results for input(s): CHOL, HDL, LDLCALC, TRIG, CHOLHDL, LDLDIRECT in the last 72 hours. Thyroid Function Tests: No results for input(s): TSH, T4TOTAL, T3FREE, THYROIDAB in the last 72 hours.  Invalid input(s): FREET3 Anemia Panel: No results for input(s): VITAMINB12, FOLATE, FERRITIN, TIBC, IRON, RETICCTPCT in the last 72 hours.  RADIOLOGY: Dg Chest 2 View  Result Date: 12/11/2015 CLINICAL DATA:  Pneumonia EXAM: CHEST  2 VIEW COMPARISON:  12/08/2015 FINDINGS: Cardiomegaly again noted. No acute infiltrate or pleural effusion. No pulmonary edema. Degenerative changes thoracic spine. IMPRESSION: Cardiomegaly again noted. No active disease. Degenerative changes thoracic spine. Electronically Signed   By: Natasha Mead M.D.   On: 12/11/2015 10:26   Dg Chest 2 View  Result Date: 12/08/2015 CLINICAL DATA:  Shortness of breath and leg swelling. EXAM: CHEST  2 VIEW COMPARISON:  None. FINDINGS: Cardiac silhouette is enlarged. Enlargement  of the central vascular structures. No large pleural effusions. Trachea is midline. Bony thorax is intact. No focal airspace disease. IMPRESSION: Cardiomegaly with vascular congestion. Electronically Signed   By: Adam  Henn M.D.   On: 12/08/2015 21:44   Ct Angio Chest Pe W And/or Wo Contrast  Result Date: 12/09/2015 CLINICAL DATA:  Shortness of breath for 1 year. Tachycardia. Increased swelling in the legs. Difficulty breathing. Fever. EXAM: CT ANGIOGRAPHY CHEST WITH CONTRAST TECHNIQUE: Multidetector CT imaging of the chest was performed using the standard protocol during bolus administration of intravenous contrast. Multiplanar CT image reconstructions and MIPs were obtained to evaluate the vascular anatomy. CONTRAST:  100 mL Isovue 370 COMPARISON:  None. FINDINGS: Technically adequate study with moderately  good opacification of the central and proximal segmental pulmonary arteries. More peripheral pulmonary arteries are not well opacified and remain indeterminate. Visualized pulmonary arteries demonstrate no significant central pulmonary embolus. Diffuse cardiac enlargement. Enlarged lymph nodes throughout the mediastinum are nonspecific but probably reactive. Esophagus is decompressed. Evaluation of lungs is limited due to respiratory motion artifact. There is a diffuse mosaic attenuation pattern to the lungs which may represent motion artifact, air trapping, or edema. Airways are patent. No pleural effusions. No pneumothorax. Included portions of the upper abdominal organs are grossly unremarkable. Degenerative changes in the thoracic spine. Review of the MIP images confirms the above findings. IMPRESSION: No large central pulmonary embolus demonstrated. Cardiac enlargement with mosaic attenuation pattern in the lungs possibly due to edema, air trapping, or motion artifact. Mild mediastinal lymphadenopathy is probably reactive although nonspecific. Electronically Signed   By: William  Stevens M.D.   On: 12/09/2015 00:26    PHYSICAL EXAM CVP 5 General: NAD. In bed.  Neck: Thick, JVP 5-6  no thyromegaly or thyroid nodule.  Lungs: Clear to auscultation bilaterally with normal respiratory effort. CV: Nondisplaced PMI.  Heart mildly tachy, regular S1/S2, +S3, no murmur.    Abdomen: obese, Soft, nontender, no hepatosplenomegaly, no distention.  Neurologic: Alert and oriented x 3.  Psych: Normal affect. Extremities: No clubbing or cyanosis.   TELEMETRY: Reviewed telemetry pt in ST in 100s  ASSESSMENT AND PLAN: 34 yo with acute systolic CHF and history of HTN.  1. Acute systolic CHF: Suspect nonischemic.  Has family history of cardiomyopathy, father had heart transplant.  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   Todays CO-OX is 70%. Euvolemic. Stop IV lasix. Transition to lasix 40 mg twice a day.  On spiro 25  mg daily.   -Continue entresto to 49-51 mg twice a day.   - Continue spironolactone to 25 mg daily.   - Continue digoxin.  - No beta blocker yet, ?low output.  -Plan RHC/LHC this morning.  - Too large for cMRI.  2. HTN: SBP 120-130s current regimen, Entresto increased  today.   3. Suspect OSA: will need sleep study as outpatient.   Cardiac rehab following.    Amy Clegg NP-C  12/13/2015 8:15 AM   Patient seen and examined with Amy Clegg, NP. We discussed all aspects of the encounter. I agree with the assessment and plan as stated above.   Feeling much better. CVP and co-ox good. HR still high. Renal function stable. Will proceed with R/L cath today. Increase Entresto. Consider low-dose b-blocker after cath.   Salvadore Valvano,MD 8:35 AM     

## 2015-12-13 NOTE — Interval H&P Note (Signed)
Cath Lab Visit (complete for each Cath Lab visit)  Clinical Evaluation Leading to the Procedure:   ACS: No.  Non-ACS:    Anginal Classification: CCS IV  Anti-ischemic medical therapy: Minimal Therapy (1 class of medications)  Non-Invasive Test Results: No non-invasive testing performed  Prior CABG: No previous CABG      History and Physical Interval Note:  12/13/2015 11:42 AM  Antonio Morgan  has presented today for surgery, with the diagnosis of hf  The various methods of treatment have been discussed with the patient and family. After consideration of risks, benefits and other options for treatment, the patient has consented to  Procedure(s): Right/Left Heart Cath and Coronary Angiography (N/A) and possible angioplasty as a surgical intervention .  The patient's history has been reviewed, patient examined, no change in status, stable for surgery.  I have reviewed the patient's chart and labs.  Questions were answered to the patient's satisfaction.     Jylian Pappalardo, Reuel Boom

## 2015-12-13 NOTE — Progress Notes (Signed)
Pt back in room from cathlab.  No c/o pain.  No s/s of any acute distress.  Right radial and left brachial site - level 0.  Call bell in reach.

## 2015-12-13 NOTE — H&P (View-Only) (Signed)
Patient ID: Antonio Morgan, male   DOB: 12/28/1980, 35 y.o.   MRN: [id number]   SUBJECTIVE:   Antonio Morgan was increased. Todays CO-OX is 73.5%. Brisk diuresis noted. Weight down another 5  pounds.   Says he feels great. Denies SOB.   Scheduled Meds: . digoxin  0.25 mg Oral Daily  . doxycycline  100 mg Oral Q12H  . enoxaparin (LOVENOX) injection  80 mg Subcutaneous Q24H  . furosemide  60 mg Intravenous Q12H  . potassium chloride  40 mEq Oral Daily  . sacubitril-valsartan  1 tablet Oral BID  . sodium chloride flush  10-40 mL Intracatheter Q12H  . spironolactone  25 mg Oral Daily   Continuous Infusions:  PRN Meds:.acetaminophen **OR** acetaminophen, colchicine, ondansetron **OR** ondansetron (ZOFRAN) IV, sodium chloride flush    Vitals:   12/13/15 0358 12/13/15 0400 12/13/15 0414 12/13/15 0500  BP: 135/89 135/89    Pulse: (!) 112 (!) 111 (!) 117 (!) 107  Resp:      Temp: 99.9 F (37.7 C)     TempSrc: Oral     SpO2: 100% 97% 99% 99%  Weight: (!) 159.8 kg (352 lb 6.4 oz)     Height: 6' 1.5" (1.867 m)       Intake/Output Summary (Last 24 hours) at 12/13/15 0815 Last data filed at 12/13/15 0400  Gross per 24 hour  Intake              960 ml  Output             4350 ml  Net            -3390 ml    LABS: Basic Metabolic Panel:  Recent Labs  36/46/80 0305 12/12/15 0314 12/13/15 0405  NA 137 139 140  K 3.7 3.8 4.3  CL 93* 94* 95*  CO2 35* 37* 38*  GLUCOSE 140* 142* 115*  BUN 13 12 11   CREATININE 1.32* 1.18 1.03  CALCIUM 8.9 8.8* 8.9  MG 1.9  --   --    Liver Function Tests: No results for input(s): AST, ALT, ALKPHOS, BILITOT, PROT, ALBUMIN in the last 72 hours. No results for input(s): LIPASE, AMYLASE in the last 72 hours. CBC:  Recent Labs  12/12/15 0314  WBC 12.6*  HGB 13.7  HCT 44.3  MCV 80.7  PLT 320   Cardiac Enzymes: No results for input(s): CKTOTAL, CKMB, CKMBINDEX, TROPONINI in the last 72 hours. BNP: Invalid input(s):  POCBNP D-Dimer: No results for input(s): DDIMER in the last 72 hours. Hemoglobin A1C: No results for input(s): HGBA1C in the last 72 hours. Fasting Lipid Panel: No results for input(s): CHOL, HDL, LDLCALC, TRIG, CHOLHDL, LDLDIRECT in the last 72 hours. Thyroid Function Tests: No results for input(s): TSH, T4TOTAL, T3FREE, THYROIDAB in the last 72 hours.  Invalid input(s): FREET3 Anemia Panel: No results for input(s): VITAMINB12, FOLATE, FERRITIN, TIBC, IRON, RETICCTPCT in the last 72 hours.  RADIOLOGY: Dg Chest 2 View  Result Date: 12/11/2015 CLINICAL DATA:  Pneumonia EXAM: CHEST  2 VIEW COMPARISON:  12/08/2015 FINDINGS: Cardiomegaly again noted. No acute infiltrate or pleural effusion. No pulmonary edema. Degenerative changes thoracic spine. IMPRESSION: Cardiomegaly again noted. No active disease. Degenerative changes thoracic spine. Electronically Signed   By: Natasha Mead M.D.   On: 12/11/2015 10:26   Dg Chest 2 View  Result Date: 12/08/2015 CLINICAL DATA:  Shortness of breath and leg swelling. EXAM: CHEST  2 VIEW COMPARISON:  None. FINDINGS: Cardiac silhouette is enlarged. Enlargement  of the central vascular structures. No large pleural effusions. Trachea is midline. Bony thorax is intact. No focal airspace disease. IMPRESSION: Cardiomegaly with vascular congestion. Electronically Signed   By: Richarda OverlieAdam  Henn M.D.   On: 12/08/2015 21:44   Ct Angio Chest Pe W And/or Wo Contrast  Result Date: 12/09/2015 CLINICAL DATA:  Shortness of breath for 1 year. Tachycardia. Increased swelling in the legs. Difficulty breathing. Fever. EXAM: CT ANGIOGRAPHY CHEST WITH CONTRAST TECHNIQUE: Multidetector CT imaging of the chest was performed using the standard protocol during bolus administration of intravenous contrast. Multiplanar CT image reconstructions and MIPs were obtained to evaluate the vascular anatomy. CONTRAST:  100 mL Isovue 370 COMPARISON:  None. FINDINGS: Technically adequate study with moderately  good opacification of the central and proximal segmental pulmonary arteries. More peripheral pulmonary arteries are not well opacified and remain indeterminate. Visualized pulmonary arteries demonstrate no significant central pulmonary embolus. Diffuse cardiac enlargement. Enlarged lymph nodes throughout the mediastinum are nonspecific but probably reactive. Esophagus is decompressed. Evaluation of lungs is limited due to respiratory motion artifact. There is a diffuse mosaic attenuation pattern to the lungs which may represent motion artifact, air trapping, or edema. Airways are patent. No pleural effusions. No pneumothorax. Included portions of the upper abdominal organs are grossly unremarkable. Degenerative changes in the thoracic spine. Review of the MIP images confirms the above findings. IMPRESSION: No large central pulmonary embolus demonstrated. Cardiac enlargement with mosaic attenuation pattern in the lungs possibly due to edema, air trapping, or motion artifact. Mild mediastinal lymphadenopathy is probably reactive although nonspecific. Electronically Signed   By: Burman NievesWilliam  Stevens M.D.   On: 12/09/2015 00:26    PHYSICAL EXAM CVP 5 General: NAD. In bed.  Neck: Thick, JVP 5-6  no thyromegaly or thyroid nodule.  Lungs: Clear to auscultation bilaterally with normal respiratory effort. CV: Nondisplaced PMI.  Heart mildly tachy, regular S1/S2, +S3, no murmur.    Abdomen: obese, Soft, nontender, no hepatosplenomegaly, no distention.  Neurologic: Alert and oriented x 3.  Psych: Normal affect. Extremities: No clubbing or cyanosis.   TELEMETRY: Reviewed telemetry pt in ST in 100s  ASSESSMENT AND PLAN: 35 yo with acute systolic CHF and history of HTN.  1. Acute systolic CHF: Suspect nonischemic.  Has family history of cardiomyopathy, father had heart transplant.  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   Todays CO-OX is 70%. Euvolemic. Stop IV lasix. Transition to lasix 40 mg twice a day.  On spiro 25  mg daily.   -Continue entresto to 49-51 mg twice a day.   - Continue spironolactone to 25 mg daily.   - Continue digoxin.  - No beta blocker yet, ?low output.  -Plan RHC/LHC this morning.  - Too large for cMRI.  2. HTN: SBP 120-130s current regimen, Entresto increased  today.   3. Suspect OSA: will need sleep study as outpatient.   Cardiac rehab following.    Amy Clegg NP-C  12/13/2015 8:15 AM   Patient seen and examined with Tonye BecketAmy Clegg, NP. We discussed all aspects of the encounter. I agree with the assessment and plan as stated above.   Feeling much better. CVP and co-ox good. HR still high. Renal function stable. Will proceed with R/L cath today. Increase Entresto. Consider low-dose b-blocker after cath.   Reign Dziuba,MD 8:35 AM

## 2015-12-14 LAB — BASIC METABOLIC PANEL
ANION GAP: 6 (ref 5–15)
BUN: 13 mg/dL (ref 6–20)
CO2: 35 mmol/L — AB (ref 22–32)
Calcium: 9.1 mg/dL (ref 8.9–10.3)
Chloride: 96 mmol/L — ABNORMAL LOW (ref 101–111)
Creatinine, Ser: 1.13 mg/dL (ref 0.61–1.24)
GFR calc Af Amer: >60 mL/min (ref >60)
GFR calc non Af Amer: >60 mL/min (ref >60)
GLUCOSE: 117 mg/dL — AB (ref 65–99)
POTASSIUM: 4.5 mmol/L (ref 3.5–5.1)
Sodium: 137 mmol/L (ref 135–145)

## 2015-12-14 LAB — CARBOXYHEMOGLOBIN
CARBOXYHEMOGLOBIN: 1.2 % (ref 0.5–1.5)
METHEMOGLOBIN: 0.9 % (ref 0.0–1.5)
O2 SAT: 72.1 %
Total hemoglobin: 15.3 g/dL (ref 13.5–18.0)

## 2015-12-14 LAB — URIC ACID: Uric Acid, Serum: 9.3 mg/dL — ABNORMAL HIGH (ref 4.4–7.6)

## 2015-12-14 MED ORDER — SPIRONOLACTONE 25 MG PO TABS: 25.0000 mg | ORAL_TABLET | Freq: Every day | ORAL | 6 refills | Status: DC

## 2015-12-14 MED ORDER — FUROSEMIDE 40 MG PO TABS: 40.0000 mg | ORAL_TABLET | Freq: Two times a day (BID) | ORAL | 6 refills | Status: DC

## 2015-12-14 MED ORDER — COLCHICINE 0.6 MG PO TABS: 0.6000 mg | ORAL_TABLET | Freq: Every day | ORAL | 6 refills | Status: DC | PRN

## 2015-12-14 MED ORDER — SACUBITRIL-VALSARTAN 97-103 MG PO TABS: 1.0000 | ORAL_TABLET | Freq: Two times a day (BID) | ORAL | 6 refills | Status: DC

## 2015-12-14 MED ORDER — SACUBITRIL-VALSARTAN 97-103 MG PO TABS
1.0000 | ORAL_TABLET | Freq: Two times a day (BID) | ORAL | Status: DC
  Administered 2015-12-14: 1 via ORAL
  Filled 2015-12-14 (×2): qty 1

## 2015-12-14 MED ORDER — DIGOXIN 250 MCG PO TABS: 0.2500 mg | ORAL_TABLET | Freq: Every day | ORAL | 6 refills | Status: DC

## 2015-12-14 MED ORDER — CARVEDILOL 3.125 MG PO TABS: 3.1250 mg | ORAL_TABLET | Freq: Two times a day (BID) | ORAL | 6 refills | Status: DC

## 2015-12-14 MED FILL — ENTRESTO 97 MG-103 MG TAB: 97-103 | 30 days supply | Qty: 60 | Fill #0 | Status: TO

## 2015-12-14 MED FILL — SPIRONOLACTONE 25 MG TABLET: 25 | 30 days supply | Qty: 30 | Fill #0

## 2015-12-14 MED FILL — FUROSEMIDE 40 MG TABLET: 40 | 30 days supply | Qty: 60 | Fill #0

## 2015-12-14 MED FILL — DIGITEK 250 MCG TABLET: 250 | 30 days supply | Qty: 30 | Fill #0 | Status: TO

## 2015-12-14 MED FILL — CARVEDILOL 3.125 MG TABLET: 3.125 | 30 days supply | Qty: 60 | Fill #0

## 2015-12-14 NOTE — Discharge Summary (Signed)
Advanced Heart Failure Team  Discharge Summary   Patient ID: Antonio Morgan MRN: [id number], DOB/AGE: 34/04/1980 34 y.o. Admit date: 12/08/2015 D/C date:     12/14/2015   Primary Discharge Diagnoses:  1. Acute Systolic Heart Failure- NICM - cors ok on 8/22/117 2. HTN 3. OSA- needs formal sleep study  4. Respiratory Failure- hypoxia--> discharged with oxygen 2 liters on exertion 5. Morbid Obesity   Hospital Course:  Antonio Morgan is a 35 y.o. male with past medical history of HTN, and obesity. [facility] for worsening dyspnea over the last year.  On admit , he was tachycardiac into the low-100's. Oxygen saturations were initially 85%. CXR on admit showed vascular congestion. CTA was negative for PE. ECHO was performed and showed severely reduced LVEF 10-15% and RV moderately/severely dilated.   Diuresed with IV lasix. PICC line was placed to guide therapy. Once fully diuresed he had  RHC/LHC 12/13/2015 with normal cors and well compensated hemodynamics. Overall diuresed 33 pounds. HF meds added slowly and will continue to be titrated in the outpatient clinic. Renal function was followed closely and remained stable.   He was hypoxic with exertion and will be discharged with 2 liters oxygen to use with exertion. He will be followed closely in the HF clinic and has follow up next week. Plan to check bmet,bnp at that time. Will also set up sleep study for suspected sleep apnea.    RHC/LHC 8/22/2017RA = 6 RV = 44/8/10 PA = 43/17 (31) PCW = 13 Fick cardiac output/index = 7.6/2.8 PVR = 2.4 WU SVR = 855  Ao sat = 94%  PA sat = 67%, 70%  Assessment: 1. Essentially normal coronaries 2. Well compensated hemodynamics with mild PAH  Discharge Weight Range: 352 pounds.  Discharge Vitals: Blood pressure (!) 112/95, pulse (!) 103, temperature 98.7 F (37.1 C), temperature source Oral, resp. rate 16, height 6' 1.5" (1.867 m), weight (!) 159.8 kg (352 lb 4.8 oz), SpO2  91 %.  Labs: Lab Results  Component Value Date   WBC 11.6 (H) 12/13/2015   HGB 13.9 12/13/2015   HCT 44.3 12/13/2015   MCV 80.1 12/13/2015   PLT 281 12/13/2015    Recent Labs Lab 12/14/15 0445  NA 137  K 4.5  CL 96*  CO2 35*  BUN 13  CREATININE 1.13  CALCIUM 9.1  GLUCOSE 117*   No results found for: CHOL, HDL, LDLCALC, TRIG BNP (last 3 results)  Recent Labs  12/08/15 2127  BNP 728.7*    ProBNP (last 3 results) No results for input(s): PROBNP in the last 8760 hours.   Diagnostic Studies/Procedures   No results found.  Discharge Medications     Medication List    STOP taking these medications   IBUPROFEN PO   irbesartan-hydrochlorothiazide 300-12.5 MG tablet Commonly known as:  AVALIDE     TAKE these medications   carvedilol 3.125 MG tablet Commonly known as:  COREG Take 1 tablet (3.125 mg total) by mouth 2 (two) times daily with a meal.   colchicine 0.6 MG tablet Take 1 tablet (0.6 mg total) by mouth daily as needed (gout).   digoxin 0.25 MG tablet Commonly known as:  LANOXIN Take 1 tablet (0.25 mg total) by mouth daily.   furosemide 40 MG tablet Commonly known as:  LASIX Take 1 tablet (40 mg total) by mouth 2 (two) times daily. What changed:  medication strength  how much to take  when to take  this   sacubitril-valsartan 97-103 MG Commonly known as:  ENTRESTO Take 1 tablet by mouth 2 (two) times daily.   spironolactone 25 MG tablet Commonly known as:  ALDACTONE Take 1 tablet (25 mg total) by mouth daily.       Disposition   The patient will be discharged in stable condition to home. Discharge Instructions    Diet - low sodium heart healthy    Complete by:  As directed   Heart Failure patients record your daily weight using the same scale at the same time of day    Complete by:  As directed   Increase activity slowly    Complete by:  As directed     Follow-up Information    Tonye Becket, NP Follow up on 12/21/2015.    Specialty:  Cardiology Why:  Heart Failure Clinic. On the 1st Floor at [facility]. Entrance C. at 1:30 Garage Code 0003 Contact information: [address] [place] [address] [phone]             Duration of Discharge Encounter: Greater than 35 minutes   Signed, Norville Dani NP-C  12/14/2015, 9:10 AM

## 2015-12-14 NOTE — Progress Notes (Signed)
CARDIAC REHAB PHASE I  Pt states he is experiencing a gout flare, was just started on medication, declines ambulation at this time due to significant pain R ankle pain, RN aware. Heart failure education completed with pt and significant other at bedside. Reviewed CHF booklet and zone tool, daily weights, sodium restrictions, heart healthy diet, activity progression, and phase 2 cardiac rehab. Pt verbalized understanding, very receptive to education. Pt agrees to phase 2 cardiac rehab referral, will send to [facility] per pt request. Pt in bed, call bell within reach.  [phone] Joylene Grapes, RN, BSN 12/14/2015 10:18 AM

## 2015-12-14 NOTE — Progress Notes (Signed)
Have alerted jermaine w [facility] that pt for dc home today. Has port o2 in room but will need conc del to home just wanted to update eq comp on del of eq. Pt has 30day free and copay card. To be followed in chf clinic and they are getting addit 30days. Pt states working on ins.

## 2015-12-14 NOTE — Progress Notes (Signed)
Patient ID: Antonio Morgan, male   DOB: 11/02/1980, 35 y.o.   MRN: [id number]   SUBJECTIVE:   Yesterday had Left and RHC with minimal CAD well compensated hemodynamics. Denies SOB.   Scheduled Meds: . carvedilol  3.125 mg Oral BID WC  . digoxin  0.25 mg Oral Daily  . doxycycline  100 mg Oral Q12H  . enoxaparin (LOVENOX) injection  80 mg Subcutaneous Q24H  . furosemide  40 mg Oral BID  . potassium chloride  40 mEq Oral Daily  . sacubitril-valsartan  1 tablet Oral BID  . sodium chloride flush  10-40 mL Intracatheter Q12H  . sodium chloride flush  3 mL Intravenous Q12H  . spironolactone  25 mg Oral Daily   Continuous Infusions:  PRN Meds:.sodium chloride, [DISCONTINUED] acetaminophen **OR** acetaminophen, acetaminophen, colchicine, ondansetron (ZOFRAN) IV, ondansetron **OR** [DISCONTINUED] ondansetron (ZOFRAN) IV, sodium chloride flush, sodium chloride flush    Vitals:   12/14/15 0010 12/14/15 0453 12/14/15 0500 12/14/15 0810  BP: (!) 143/87 137/86  (!) 112/95  Pulse: 93 88  (!) 103  Resp:    16  Temp: 98.6 F (37 C) 99.5 F (37.5 C)  98.7 F (37.1 C)  TempSrc: Axillary Oral  Oral  SpO2: 98% 100%  91%  Weight:   (!) 159.8 kg (352 lb 4.8 oz)   Height:        Intake/Output Summary (Last 24 hours) at 12/14/15 0847 Last data filed at 12/14/15 0600  Gross per 24 hour  Intake          1538.18 ml  Output             2050 ml  Net          -511.82 ml    LABS: Basic Metabolic Panel:  Recent Labs  56/38/93 0405 12/13/15 1417 12/14/15 0445  NA 140  --  137  K 4.3  --  4.5  CL 95*  --  96*  CO2 38*  --  35*  GLUCOSE 115*  --  117*  BUN 11  --  13  CREATININE 1.03 0.98 1.13  CALCIUM 8.9  --  9.1   Liver Function Tests: No results for input(s): AST, ALT, ALKPHOS, BILITOT, PROT, ALBUMIN in the last 72 hours. No results for input(s): LIPASE, AMYLASE in the last 72 hours. CBC:  Recent Labs  12/12/15 0314 12/13/15 1417  WBC 12.6* 11.6*  HGB 13.7 13.9  HCT 44.3  44.3  MCV 80.7 80.1  PLT 320 281   Cardiac Enzymes: No results for input(s): CKTOTAL, CKMB, CKMBINDEX, TROPONINI in the last 72 hours. BNP: Invalid input(s): POCBNP D-Dimer: No results for input(s): DDIMER in the last 72 hours. Hemoglobin A1C: No results for input(s): HGBA1C in the last 72 hours. Fasting Lipid Panel: No results for input(s): CHOL, HDL, LDLCALC, TRIG, CHOLHDL, LDLDIRECT in the last 72 hours. Thyroid Function Tests: No results for input(s): TSH, T4TOTAL, T3FREE, THYROIDAB in the last 72 hours.  Invalid input(s): FREET3 Anemia Panel: No results for input(s): VITAMINB12, FOLATE, FERRITIN, TIBC, IRON, RETICCTPCT in the last 72 hours.  RADIOLOGY: Dg Chest 2 View  Result Date: 12/11/2015 CLINICAL DATA:  Pneumonia EXAM: CHEST  2 VIEW COMPARISON:  12/08/2015 FINDINGS: Cardiomegaly again noted. No acute infiltrate or pleural effusion. No pulmonary edema. Degenerative changes thoracic spine. IMPRESSION: Cardiomegaly again noted. No active disease. Degenerative changes thoracic spine. Electronically Signed   By: Natasha Mead M.D.   On: 12/11/2015 10:26   Dg Chest 2 View  Result Date: 12/08/2015 CLINICAL DATA:  Shortness of breath and leg swelling. EXAM: CHEST  2 VIEW COMPARISON:  None. FINDINGS: Cardiac silhouette is enlarged. Enlargement of the central vascular structures. No large pleural effusions. Trachea is midline. Bony thorax is intact. No focal airspace disease. IMPRESSION: Cardiomegaly with vascular congestion. Electronically Signed   By: Richarda OverlieAdam  Henn M.D.   On: 12/08/2015 21:44   Ct Angio Chest Pe W And/or Wo Contrast  Result Date: 12/09/2015 CLINICAL DATA:  Shortness of breath for 1 year. Tachycardia. Increased swelling in the legs. Difficulty breathing. Fever. EXAM: CT ANGIOGRAPHY CHEST WITH CONTRAST TECHNIQUE: Multidetector CT imaging of the chest was performed using the standard protocol during bolus administration of intravenous contrast. Multiplanar CT image  reconstructions and MIPs were obtained to evaluate the vascular anatomy. CONTRAST:  100 mL Isovue 370 COMPARISON:  None. FINDINGS: Technically adequate study with moderately good opacification of the central and proximal segmental pulmonary arteries. More peripheral pulmonary arteries are not well opacified and remain indeterminate. Visualized pulmonary arteries demonstrate no significant central pulmonary embolus. Diffuse cardiac enlargement. Enlarged lymph nodes throughout the mediastinum are nonspecific but probably reactive. Esophagus is decompressed. Evaluation of lungs is limited due to respiratory motion artifact. There is a diffuse mosaic attenuation pattern to the lungs which may represent motion artifact, air trapping, or edema. Airways are patent. No pleural effusions. No pneumothorax. Included portions of the upper abdominal organs are grossly unremarkable. Degenerative changes in the thoracic spine. Review of the MIP images confirms the above findings. IMPRESSION: No large central pulmonary embolus demonstrated. Cardiac enlargement with mosaic attenuation pattern in the lungs possibly due to edema, air trapping, or motion artifact. Mild mediastinal lymphadenopathy is probably reactive although nonspecific. Electronically Signed   By: Burman NievesWilliam  Stevens M.D.   On: 12/09/2015 00:26    PHYSICAL EXAM CVP 5 General: NAD. In bed.  Neck: Thick, JVP 5-6  no thyromegaly or thyroid nodule.  Lungs: Clear to auscultation bilaterally with normal respiratory effort. On 2 liters oxygen.  CV: Nondisplaced PMI.  Heart mildly tachy, regular S1/S2, +S3, no murmur.    Abdomen: obese, Soft, nontender, no hepatosplenomegaly, no distention.  Neurologic: Alert and oriented x 3.  Psych: Normal affect. Extremities: No clubbing or cyanosis.   TELEMETRY: Reviewed telemetry pt in ST in 100s  ASSESSMENT AND PLAN: 35 yo with acute systolic CHF and history of HTN.  1. Acute systolic CHF: Suspect nonischemic.  Has  family history of cardiomyopathy, father had heart transplant.  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   Todays CO-OX is 70%. Euvolemic. Continue lasix 40 mg twice a day. NYHA II.  On spiro 25 mg daily.  -Increase entresto 97-103 mg - Continue spironolactone to 25 mg daily.   - Continue digoxin.  -Continue carvedilol 3.125 mg twice a day.   - Too large for cMRI.  2. HTN: SBP 110-130.  Enntresto increased  today.   3. Suspect OSA: will need sleep study as outpatient.   Home today. Follow next week in the HF clinic.    Amy Clegg NP-C  12/14/2015 8:47 AM   Patient seen and examined with Tonye BecketAmy Clegg, NP. We discussed all aspects of the encounter. I agree with the assessment and plan as stated above.   Cath results reviewed with him personally. He looks good. Volume status ok . Renal function stable. HR improved.   Will provide 30 days of free meds today. F/u in HF Clinic next week.   Browning Southwood,MD 9:48 AM

## 2015-12-15 ENCOUNTER — Encounter: Payer: Self-pay | Admitting: *Deleted

## 2015-12-15 ENCOUNTER — Ambulatory Visit: Payer: Self-pay | Admitting: Internal Medicine

## 2015-12-15 NOTE — Progress Notes (Signed)
Letter mailed

## 2015-12-21 ENCOUNTER — Ambulatory Visit (HOSPITAL_COMMUNITY)
Admit: 2015-12-21 | Discharge: 2015-12-21 | Disposition: A | Discharge status: Home / Self Care | Payer: BC Managed Care – PPO | Source: Ambulatory Visit | Attending: Cardiology | Admitting: Cardiology

## 2015-12-21 ENCOUNTER — Encounter (HOSPITAL_COMMUNITY): Payer: Self-pay

## 2015-12-21 VITALS — BP 100/64 | HR 98 | Wt 341.4 lb

## 2015-12-21 DIAGNOSIS — I428 Other cardiomyopathies: Secondary | ICD-10-CM | POA: Insufficient documentation

## 2015-12-21 DIAGNOSIS — J9691 Respiratory failure, unspecified with hypoxia: Secondary | ICD-10-CM | POA: Diagnosis not present

## 2015-12-21 DIAGNOSIS — R0683 Snoring: Secondary | ICD-10-CM

## 2015-12-21 DIAGNOSIS — Z8249 Family history of ischemic heart disease and other diseases of the circulatory system: Secondary | ICD-10-CM | POA: Diagnosis not present

## 2015-12-21 DIAGNOSIS — Z6841 Body Mass Index (BMI) 40.0 and over, adult: Secondary | ICD-10-CM | POA: Diagnosis not present

## 2015-12-21 DIAGNOSIS — M109 Gout, unspecified: Secondary | ICD-10-CM | POA: Diagnosis not present

## 2015-12-21 DIAGNOSIS — Z87891 Personal history of nicotine dependence: Secondary | ICD-10-CM | POA: Insufficient documentation

## 2015-12-21 DIAGNOSIS — I5022 Chronic systolic (congestive) heart failure: Secondary | ICD-10-CM

## 2015-12-21 DIAGNOSIS — R0609 Other forms of dyspnea: Secondary | ICD-10-CM

## 2015-12-21 DIAGNOSIS — I110 Hypertensive heart disease with heart failure: Secondary | ICD-10-CM | POA: Diagnosis not present

## 2015-12-21 DIAGNOSIS — Z79899 Other long term (current) drug therapy: Secondary | ICD-10-CM | POA: Insufficient documentation

## 2015-12-21 DIAGNOSIS — I10 Essential (primary) hypertension: Secondary | ICD-10-CM

## 2015-12-21 DIAGNOSIS — Z9981 Dependence on supplemental oxygen: Secondary | ICD-10-CM | POA: Diagnosis not present

## 2015-12-21 LAB — BASIC METABOLIC PANEL
Anion gap: 13 (ref 5–15)
BUN: 25 mg/dL — AB (ref 6–20)
CHLORIDE: 100 mmol/L — AB (ref 101–111)
CO2: 21 mmol/L — ABNORMAL LOW (ref 22–32)
CREATININE: 1.33 mg/dL — AB (ref 0.61–1.24)
Calcium: 10 mg/dL (ref 8.9–10.3)
GFR calc Af Amer: >60 mL/min (ref >60)
GFR calc non Af Amer: >60 mL/min (ref >60)
GLUCOSE: 100 mg/dL — AB (ref 65–99)
POTASSIUM: 5.3 mmol/L — AB (ref 3.5–5.1)
Sodium: 134 mmol/L — ABNORMAL LOW (ref 135–145)

## 2015-12-21 LAB — BRAIN NATRIURETIC PEPTIDE: B Natriuretic Peptide: 167.7 pg/mL — ABNORMAL HIGH (ref 0.0–100.0)

## 2015-12-21 MED ORDER — FUROSEMIDE 40 MG PO TABS: 40.0000 mg | ORAL_TABLET | Freq: Every day | ORAL | 6 refills | Status: DC

## 2015-12-21 MED ORDER — CARVEDILOL 3.125 MG PO TABS: 3.1250 mg | ORAL_TABLET | Freq: Two times a day (BID) | ORAL | 6 refills | Status: DC

## 2015-12-21 NOTE — Progress Notes (Signed)
PCP: Dr Juanetta GoslingHawkins  Primary Cardiologist:Dr Bensimhon   HPI: Antonio Morgan E Antonio Morgan a 35 y.o.male with a past medical history of HTN and obesity diagnosed with acute systolic heart failure august [id number].   [facility] on [facility]/2015 for worsening dyspnea over the last year.  On admit , he was tachycardiac into the low-100's. Oxygen saturations were initially 85%. CXR on admit showed vascular congestion. CTA was negative for PE. ECHO was performed and showed severely reduced LVEF 10-15% and RV moderately/severely dilated. Diuresed with IV lasix and transitioned to lasix 40 mg twice daily. RHC/LHC 12/13/2015 with normal cors and well compensated hemodynamics. Also discharged on 2 liters oxygen continuously. Discharge weight was 352 pounds.   Today he returns for HF follow up. OVerall feeling good. Denies SOB/PND/Orthopnea/dizziness. Wears 2 liters oxygen. Drinking < 2 liters per day. Day time fatigue. Appetite picking up. Following low salt diet. Taking all medications. Currently not working. 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    RHC/LHC 12/13/2015 RA = 6 RV = 44/8/10 PA = 43/17 (31) PCW = 13 Fick cardiac output/index = 7.6/2.8 PVR = 2.4 WU SVR = 855  Ao sat = 94%  PA sat = 67%, 70% Assessment: 1. Essentially normal coronaries 2. Well compensated hemodynamics with mild PAH   ROS: All systems negative except as listed in HPI, PMH and Problem List.  SH:  Social History   Social History  . Marital status: Single    Spouse name: N/A  . Number of children: N/A  . Years of education: 7714   Occupational History  . guitarist    Social History Main Topics  . Smoking status: Former Smoker    Years: 10.00    Quit date: 10/28/2013  . Smokeless tobacco: Never Used  . Alcohol use No  . Drug use: No  . Sexual activity: Not on file   Other Topics Concern  . Not on file   Social History Narrative  . No narrative on file    FH:  Family History  Problem Relation  Age of Onset  . Heart disease Father     heart transplant  . Hypertension Mother     Past Medical History:  Diagnosis Date  . Gout   . Hypertension   . Obese     Current Outpatient Prescriptions  Medication Sig Dispense Refill  . carvedilol (COREG) 3.125 MG tablet Take 1 tablet (3.125 mg total) by mouth 2 (two) times daily with a meal. 60 tablet 6  . colchicine 0.6 MG tablet Take 1 tablet (0.6 mg total) by mouth daily as needed (gout). 30 tablet 6  . digoxin (LANOXIN) 0.25 MG tablet Take 1 tablet (0.25 mg total) by mouth daily. 30 tablet 6  . furosemide (LASIX) 40 MG tablet Take 1 tablet (40 mg total) by mouth 2 (two) times daily. 60 tablet 6  . sacubitril-valsartan (ENTRESTO) 97-103 MG Take 1 tablet by mouth 2 (two) times daily. 60 tablet 6  . spironolactone (ALDACTONE) 25 MG tablet Take 1 tablet (25 mg total) by mouth daily. 30 tablet 6   No current facility-administered medications for this encounter.     Vitals:   12/21/15 1359  BP: 100/64  Pulse: 98  SpO2: 97%  Weight: (!) 341 lb 6 oz (154.8 kg)    PHYSICAL EXAM:  General:  Well appearing. No resp difficulty. Arrived in a wheelchair.  HEENT: normal Neck: supple. JVP flat. Carotids 2+ bilaterally; no bruits. No lymphadenopathy or thryomegaly appreciated.  Cor: PMI normal. Regular rate & rhythm. No rubs, gallops or murmurs. Lungs: clear on 2 liters oxygen.  Abdomen: obese, soft, nontender, nondistended. No hepatosplenomegaly. No bruits or masses. Good bowel sounds. Extremities: no cyanosis, clubbing, rash, edema Neuro: alert & orientedx3, cranial nerves grossly intact. Moves all 4 extremities w/o difficulty. Affect pleasant.  ASSESSMENT & PLAN: 1. Chronic Systolic Heart Failure- NICM - cors ok on 8/22/117. ECHO 11/2015 EF 10-15%.  NYHA II-III. Volume status low. Cut back lasix to 40 mg daily.  Continue coreg 3.125 mg in am and increase night time dose to 6.25 mg .  Digoxin 0.25 mg daily. Dig level next visit.   Continue entresto 97-103 twice a day.  Today we reinforced daily weights, low salt diet, and limiting fluid intake to < 2 liters per day. 2. HTN- Stable  3. OSA- needs formal sleep study . Refer to pulmonary for sleep study.  4. Respiratory Failure- hypoxia--> discharged with oxygen 2 liters on exertion 5. Morbid Obesity - needs to lose weight. Discussed portion control. 6. Gout- continue colchicine as needed.     Follow up 2 weeks.   Antonio Iacovelli NP-C  2:20 PM

## 2015-12-21 NOTE — Patient Instructions (Signed)
DECREASE Lasix to 40 mg once daily.  INCREASE Carvedilol (Coreg) to 3.125 mg (1 tab) in am and 6.25 mg (2 tabs) in pm.  Routine lab work today. Will notify you of abnormal results, otherwise no news is good news!  Will refer you to [facility] at [facility] for home oxygen management. [address], [place] [address] Phone: [phone]  _________________________________________________  _________________________________________________  Will schedule you for a sleep study at [facility].  _________________________________________________  _________________________________________________  Contact HR to get short term disability paperwork. Have any work-related/leave of absence forms faxed to [phone] Attn: Megan  Follow up 2 weeks with Amy Clegg NP-C.  Do the following things EVERYDAY: 1) Weigh yourself in the morning before breakfast. Write it down and keep it in a log. 2) Take your medicines as prescribed 3) Eat low salt foods-Limit salt (sodium) to 2000 mg per day.  4) Stay as active as you can everyday 5) Limit all fluids for the day to less than 2 liters

## 2015-12-22 ENCOUNTER — Inpatient Hospital Stay (HOSPITAL_COMMUNITY): Payer: Self-pay

## 2015-12-22 ENCOUNTER — Telehealth (HOSPITAL_COMMUNITY): Payer: Self-pay

## 2015-12-22 MED ORDER — SPIRONOLACTONE 25 MG PO TABS: 12.5000 mg | ORAL_TABLET | Freq: Every day | ORAL | 6 refills | Status: DC

## 2015-12-22 NOTE — Telephone Encounter (Signed)
  Basic metabolic panel  Order: [id number]  Status:  Final result Visible to patient:  No (Not Released) Dx:  Chronic systolic congestive heart fai...  Notes Recorded by Chyrl Civatte, RN on 12/22/2015 at 4:47 PM EDT Patient aware. Aware and agreeable of instructions and med changes. Rx updated to pharmacy electronically.  ------  Notes Recorded by Theresia Bough, CMA on 12/22/2015 at 4:07 PM EDT Left message for patient to call back. [phone] (H) ------  Notes Recorded by Sherald Hess, NP on 12/21/2015 at 4:12 PM EDT Call him and ask him to hold lasix and spiro for 2 days. Then restart spiro 12.5 mg daily. Cut lasix back to 40 mg daily. Repeat BMEt next week.

## 2015-12-23 ENCOUNTER — Ambulatory Visit (HOSPITAL_BASED_OUTPATIENT_CLINIC_OR_DEPARTMENT_OTHER): Payer: BC Managed Care – PPO | Attending: Adult Health | Admitting: Internal Medicine

## 2015-12-23 ENCOUNTER — Encounter (HOSPITAL_COMMUNITY): Payer: Self-pay

## 2015-12-23 VITALS — Ht 73.5 in | Wt 332.0 lb

## 2015-12-23 DIAGNOSIS — I493 Ventricular premature depolarization: Secondary | ICD-10-CM | POA: Insufficient documentation

## 2015-12-23 DIAGNOSIS — Z6841 Body Mass Index (BMI) 40.0 and over, adult: Secondary | ICD-10-CM | POA: Insufficient documentation

## 2015-12-23 DIAGNOSIS — I509 Heart failure, unspecified: Secondary | ICD-10-CM | POA: Diagnosis not present

## 2015-12-23 DIAGNOSIS — G4733 Obstructive sleep apnea (adult) (pediatric): Secondary | ICD-10-CM | POA: Diagnosis not present

## 2015-12-23 DIAGNOSIS — Z79899 Other long term (current) drug therapy: Secondary | ICD-10-CM | POA: Insufficient documentation

## 2015-12-23 DIAGNOSIS — G4736 Sleep related hypoventilation in conditions classified elsewhere: Secondary | ICD-10-CM | POA: Insufficient documentation

## 2015-12-23 DIAGNOSIS — R0683 Snoring: Secondary | ICD-10-CM | POA: Insufficient documentation

## 2015-12-23 DIAGNOSIS — I110 Hypertensive heart disease with heart failure: Secondary | ICD-10-CM | POA: Insufficient documentation

## 2015-12-23 NOTE — Progress Notes (Signed)
FMLA paperwork completed during patient's 12/21/2015, patient took original copy with him at discharge of OV. Copy of forms scanned into patient's electronic medical records.  Ave Filter, RN

## 2015-12-28 ENCOUNTER — Ambulatory Visit (INDEPENDENT_AMBULATORY_CARE_PROVIDER_SITE_OTHER): Payer: BC Managed Care – PPO | Admitting: Internal Medicine

## 2015-12-28 ENCOUNTER — Encounter: Payer: Self-pay | Admitting: Internal Medicine

## 2015-12-28 VITALS — BP 110/82 | HR 63 | Ht 73.5 in | Wt 343.0 lb

## 2015-12-28 DIAGNOSIS — J9611 Chronic respiratory failure with hypoxia: Secondary | ICD-10-CM

## 2015-12-28 DIAGNOSIS — R0609 Other forms of dyspnea: Secondary | ICD-10-CM

## 2015-12-28 NOTE — Progress Notes (Signed)
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 Acute Systolic Heart Failure- NICM - cors ok on 8/22/117 2. HTN 3. OSA- needs formal sleep study  4. Respiratory Failure- hypoxia--> discharged with oxygen 2 liters on exertion 5. Morbid Obesity   Hospital Course:  Antonio GerlachJason E Cornigansis 35 y.o.male with past medical history of HTN, and obesity. [facility] for worsening dyspnea over the last year.  On admit , he was tachycardiac into the low-100's. Oxygen saturations were initially 85%. CXR on admit showed vascular congestion. CTA was negative for PE. ECHO was performed and showed severely reduced LVEF 10-15% and RV moderately/severely dilated.   Diuresed with IV lasix. PICC line was placed to guide therapy. Once fully diuresed he had  RHC/LHC 12/13/2015 with normal cors and well compensated hemodynamics. Overall diuresed 33 pounds. HF meds added slowly and will continue to be titrated in the outpatient clinic. Renal function was followed closely and remained stable.   He was hypoxic with exertion and will be discharged with 2 liters oxygen to use with exertion. He will be followed closely in the HF clinic and has follow up next week. Plan to check bmet,bnp at that time. Will also set up sleep study for suspected sleep apnea.    RHC/LHC 8/22/2017RA = 6 RV = 44/8/10 PA = 43/17 (31) PCW = 13 Fick cardiac output/index = 7.6/2.8 PVR = 2.4 WU SVR = 855  Ao sat = 94%  PA sat = 67%, 70%  Assessment: 1. Essentially normal coronaries 2. Well compensated hemodynamics with mild PH    12/28/2015 1st [place]  Pulmonary office visit/ Aysen Shieh   Chief Complaint  Patient presents with  . Pulmonary Consult    Referred by Dr. Teressa LowerBensimohn for eval of hypoxia. Pt started on o2 2 wks ago 3lpm 24/7. He denies having any respiratory co's.   much better since disharge on 3lpm assoc with  leg swelling resolved  Back to baseline chronic doe = MMRC1 = can walk nl pace, flat grade, can't hurry or go uphills or steps s sob  Off of 02 with 0k sats by self monitor   No obvious day to day or daytime variability or assoc excess/ purulent sputum or mucus plugs or hemoptysis or cp or chest tightness, subjective wheeze or overt sinus or hb symptoms. No unusual exp hx or h/o childhood pna/ asthma or knowledge of premature birth.  Sleeping ok without nocturnal  or early am exacerbation  of respiratory  c/o's or need for noct saba. Also denies any obvious fluctuation of symptoms with weather or environmental changes or other aggravating or alleviating factors except as outlined above   Current Medications, Allergies, Complete Past Medical History, Past Surgical History, Family History, and Social History were reviewed in [facility] electronic medical record.  ROS  The following are not active complaints unless bolded sore throat, dysphagia, dental problems, itching, sneezing,  nasal congestion or excess/ purulent secretions, ear ache,   fever, chills, sweats, unintended wt loss, classically pleuritic or exertional cp,  orthopnea pnd or  leg swelling, presyncope, palpitations, abdominal pain, anorexia, nausea, vomiting, diarrhea  or change in bowel or bladder habits, change in stools or urine, dysuria,hematuria,  rash, arthralgias, visual complaints, headache, numbness, weakness or ataxia or problems with walking or coordination,  change in mood/affect or memory.           Review of Systems     Objective:   Physical Exam    obese pleasant bm nad  Wt Readings from Last 3 Encounters:  12/28/15 (!) 343 lb (155.6 kg)   12/23/15 (!) 332 lb (150.6 kg)  12/21/15 (!) 341 lb 6 oz (154.8 kg)    Vital signs reviewed - note sats 100% on 3lpm on arrival  And 96% RA at rest    HEENT: nl dentition, turbinates, and oropharynx. Nl external ear canals without cough reflex   NECK :  without JVD/Nodes/TM/ nl carotid upstrokes bilaterally   LUNGS: no acc muscle use,  Nl contour chest which is clear to A and P bilaterally without cough on insp or exp maneuvers   CV:  RRR  no s3 or murmur or increase in P2, no edema   ABD:  soft and nontender with nl inspiratory excursion in the supine position. No bruits or organomegaly, bowel sounds nl  MS:  Nl gait/ ext warm without deformities, calf tenderness, cyanosis or clubbing No obvious joint restrictions   SKIN: warm and dry without lesions    NEURO:  alert, approp, nl sensorium with  no motor deficits       I personally reviewed images and agree with radiology impression as follows:  CTa Chest   > No large central pulmonary embolus demonstrated. Cardiac enlargement with mosaic attenuation pattern in the lungs possibly due to edema, air trapping, or motion artifact. Mild mediastinal lymphadenopathy is probably reactive although nonspecific.     Assessment:

## 2015-12-28 NOTE — Patient Instructions (Addendum)
Goal is to keep your 02 at or above 94% at all times and always wear 2lpm at bedtime until advised otherwise  You do not appear to have any significant lung problem so no need for pulmonary medications and pulmonary follow up is as needed

## 2015-12-29 ENCOUNTER — Encounter: Payer: Self-pay | Admitting: Internal Medicine

## 2015-12-29 DIAGNOSIS — J9611 Chronic respiratory failure with hypoxia: Secondary | ICD-10-CM | POA: Insufficient documentation

## 2015-12-29 NOTE — Assessment & Plan Note (Signed)
Multifactorial but mostly related to wt gain/ chf > no primary pulmonary problem identified

## 2015-12-29 NOTE — Assessment & Plan Note (Signed)
Body mass index is 44.64 kg/m.  Lab Results  Component Value Date   TSH 2.665 12/09/2015     Contributing to gerd tendency/ doe/reviewed the need and the process to achieve and maintain neg calorie balance > defer f/u primary care including intermittently monitoring thyroid status

## 2015-12-29 NOTE — Assessment & Plan Note (Signed)
Spirometry 12/28/2015  Restrictive changes only c/w MO  No evidence of copd/ab or PAH with this acute deterioration clearly related to chf that has been reversed under excellent care by cards and ok to begin titrating down the daytime 02 but remain on  2lpm hs until sort out his noct needs > sleep study planned and f/u sleep medicine prn   Total time devoted to counseling  = 35/36m review case with pt/wife including extensive inpt records/  discussion of options/alternatives/ personally creating written instructions  in presence of pt  then going over those specific  Instructions directly with the pt including how to use all of the meds but in particular covering each new medication in detail and the difference between the maintenance/automatic meds and the prns using an action plan format for the latter.

## 2016-01-03 ENCOUNTER — Telehealth (HOSPITAL_COMMUNITY): Payer: Self-pay | Admitting: *Deleted

## 2016-01-03 ENCOUNTER — Encounter (HOSPITAL_COMMUNITY): Payer: Self-pay | Admitting: *Deleted

## 2016-01-03 NOTE — Telephone Encounter (Signed)
Letter faxed.

## 2016-01-03 NOTE — Telephone Encounter (Signed)
Pt needs a letter stating he is out of worked faxed to:  1. Drema Halon [phone] 2. [place] [phone] 3. 2 copies to [place] [phone]  Will discuss ? RTW date w/MD can fax letters

## 2016-01-04 ENCOUNTER — Encounter (HOSPITAL_COMMUNITY): Payer: Self-pay | Admitting: Cardiology

## 2016-01-04 ENCOUNTER — Ambulatory Visit (HOSPITAL_COMMUNITY)
Admission: RE | Admit: 2016-01-04 | Discharge: 2016-01-04 | Disposition: A | Discharge status: Home / Self Care | Payer: BC Managed Care – PPO | Source: Ambulatory Visit | Attending: Cardiology | Admitting: Cardiology

## 2016-01-04 VITALS — BP 128/66 | HR 66 | Wt 338.8 lb

## 2016-01-04 DIAGNOSIS — Z6841 Body Mass Index (BMI) 40.0 and over, adult: Secondary | ICD-10-CM | POA: Insufficient documentation

## 2016-01-04 DIAGNOSIS — Z9981 Dependence on supplemental oxygen: Secondary | ICD-10-CM | POA: Diagnosis not present

## 2016-01-04 DIAGNOSIS — Z87891 Personal history of nicotine dependence: Secondary | ICD-10-CM | POA: Diagnosis not present

## 2016-01-04 DIAGNOSIS — I5022 Chronic systolic (congestive) heart failure: Secondary | ICD-10-CM

## 2016-01-04 DIAGNOSIS — G4733 Obstructive sleep apnea (adult) (pediatric): Secondary | ICD-10-CM | POA: Insufficient documentation

## 2016-01-04 DIAGNOSIS — R0683 Snoring: Secondary | ICD-10-CM | POA: Diagnosis not present

## 2016-01-04 DIAGNOSIS — J9691 Respiratory failure, unspecified with hypoxia: Secondary | ICD-10-CM | POA: Diagnosis not present

## 2016-01-04 DIAGNOSIS — I10 Essential (primary) hypertension: Secondary | ICD-10-CM | POA: Diagnosis not present

## 2016-01-04 DIAGNOSIS — M109 Gout, unspecified: Secondary | ICD-10-CM | POA: Insufficient documentation

## 2016-01-04 NOTE — Progress Notes (Signed)
Advanced Heart Failure Medication Review by a Pharmacist  Does the patient  feel that his/her medications are working for him/her?  yes  Has the patient been experiencing any side effects to the medications prescribed?  no  Does the patient measure his/her own blood pressure or blood glucose at home?  no   Does the patient have any problems obtaining medications due to transportation or finances?   No - got [employer] but have not gotten card yet  Understanding of regimen: good Understanding of indications: good Potential of compliance: good Patient understands to avoid NSAIDs. Patient understands to avoid decongestants.  Issues to address at subsequent visits: None   Pharmacist comments:  [name] is a pleasant 35 yo M presenting with his significant other and without a medication list but with good recall of his regimen. He reports good compliance with his regimen and did not have any specific medication-related questions or concerns for me at this time.   [name]. Bonnye Fava, PharmD, BCPS, CPP Clinical Pharmacist Pager: [phone] Phone: [phone] 01/04/2016 10:40 AM      Time with patient: 10 minutes Preparation and documentation time: 2 minutes Total time: 12 minutes

## 2016-01-04 NOTE — Progress Notes (Signed)
PCP: Dr Juanetta Gosling  Primary Cardiologist:Dr Bensimhon   HPI: Antonio Morgan a 35 y.o.male with a past medical history of HTN and obesity diagnosed with acute systolic heart failure august 6759.   He presented to [facility] ED on 12/08/2015 for worsening dyspnea over the last year.  On admit , he was tachycardiac into the low-100's. Oxygen saturations were initially 85%. CXR on admit showed vascular congestion. CTA was negative for PE. ECHO was performed and showed severely reduced LVEF 10-15% and RV moderately/severely dilated. Diuresed with IV lasix and transitioned to lasix 40 mg twice daily. RHC/LHC 12/13/2015 with normal cors and well compensated hemodynamics. Also discharged on 2 liters oxygen continuously. Discharge weight was 352 pounds.   Today he returns for HF follow up. Last visit night time carvedilol increased to 6.25 mg. Had sleep study in September. Waiting on results. Overall feeling good. Denies SOB/PND/Orthopnea. Able to walk up 10 steps. Not walking much.  Weight at home 337 pounds. Appetite ok. Tries to follow low salt diet. Limits his fluids < 2 liters per day. He continues on 3 liters. Taking all medications. Currently not working. 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    RHC/LHC 12/13/2015 RA = 6 RV = 44/8/10 PA = 43/17 (31) PCW = 13 Fick cardiac output/index = 7.6/2.8 PVR = 2.4 WU SVR = 855  Ao sat = 94%  PA sat = 67%, 70% Assessment: 1. Essentially normal coronaries 2. Well compensated hemodynamics with mild PAH   ROS: All systems negative except as listed in HPI, PMH and Problem List.  SH:  Social History   Social History  . Marital status: Single    Spouse name: N/A  . Number of children: N/A  . Years of education: 47   Occupational History  . guitarist    Social History Main Topics  . Smoking status: Former Smoker    Packs/day: 1.00    Years: 10.00    Quit date: 04/23/2012  . Smokeless tobacco: Never Used  . Alcohol use No  . Drug  use: No  . Sexual activity: Not on file   Other Topics Concern  . Not on file   Social History Narrative  . No narrative on file    FH:  Family History  Problem Relation Age of Onset  . Heart disease Father     heart transplant  . Hypertension Mother     Past Medical History:  Diagnosis Date  . Gout   . Hypertension   . Obese     Current Outpatient Prescriptions  Medication Sig Dispense Refill  . acetaminophen (TYLENOL) 325 MG tablet Take 650 mg by mouth every 6 (six) hours as needed for mild pain.     . carvedilol (COREG) 3.125 MG tablet Take 1 tablet (3.125 mg total) by mouth 2 (two) times daily with a meal. Take 3.125 mg (1 tab) in am and 6.25 mg (2 tabs) in pm. 90 tablet 6  . digoxin (LANOXIN) 0.25 MG tablet Take 1 tablet (0.25 mg total) by mouth daily. 30 tablet 6  . furosemide (LASIX) 40 MG tablet Take 1 tablet (40 mg total) by mouth daily. 30 tablet 6  . OXYGEN 3lpm 24/7  [facility]    . sacubitril-valsartan (ENTRESTO) 97-103 MG Take 1 tablet by mouth 2 (two) times daily. 60 tablet 6  . spironolactone (ALDACTONE) 25 MG tablet Take 25 mg by mouth daily.    . colchicine 0.6 MG tablet Take 1 tablet (0.6 mg total) by  mouth daily as needed (gout). (Patient not taking: Reported on 01/04/2016) 30 tablet 6   No current facility-administered medications for this encounter.     Vitals:   01/04/16 0956  BP: 128/66  Pulse: 66  SpO2: 97%  Weight: (!) 338 lb 12.8 oz (153.7 kg)    PHYSICAL EXAM:  General:  Well appearing. No resp difficulty. Arrived in a wheelchair.  HEENT: normal Neck: supple. JVP flat. Carotids 2+ bilaterally; no bruits. No lymphadenopathy or thryomegaly appreciated. Cor: PMI normal. Regular rate & rhythm. No rubs, gallops or murmurs. Lungs: clear on 2 liters oxygen.  Abdomen: obese, soft, nontender, nondistended. No hepatosplenomegaly. No bruits or masses. Good bowel sounds. Extremities: no cyanosis, clubbing, rash, edema Neuro: alert & orientedx3, cranial  nerves grossly intact. Moves all 4 extremities w/o difficulty. Affect pleasant.  ASSESSMENT & PLAN: 1. Chronic Systolic Heart Failure- NICM - cors ok on 8/22/117. ECHO 11/2015 EF 10-15%.  NYHA II-III. Volume status low. Continue  lasix to 40 mg daily.  Continue coreg 3.125 mg in am and increase night time dose to 6.25 mg . Unable to up titrate as his heart at home has been high 40s and mid 50s. Today EKG NSR 99 bpm.  Digoxin 0.25 mg daily. Dig level  0.3  Continue entresto 97-103 twice a day.  Plan to repeat ECHO after HF meds optimized.  Today we reinforced daily weights, low salt diet, and limiting fluid intake to < 2 liters per day. 2. HTN- Stable  3. OSA- needs formal sleep study . Refer to pulmonary for sleep study.  4. Respiratory Failure- hypoxia--> discharged with oxygen 2 liters on exertion 5. Morbid Obesity - needs to lose weight. Discussed portion control. 6. Gout- continue colchicine as needed.    Stay out of work for now. Reassess in 4 weeks.  Follow up 4  weeks.   Amy Clegg NP-C  10:28 AM

## 2016-01-04 NOTE — Patient Instructions (Signed)
Your physician recommends that you schedule a follow-up appointment in: 2 weeks  Please increase activity daily  as tolerated

## 2016-01-05 ENCOUNTER — Encounter (HOSPITAL_COMMUNITY): Payer: Self-pay

## 2016-01-05 NOTE — Progress Notes (Signed)
Type of request/paperwork being submitted for CHF clinic to complete: Disability Patient dropped off paperwork to CHF clinic office on: 01/04/2016 Patient requests completion of paperwork due by: 01/05/2016 Patient requests paperwork faxed/mailed/picked up: fax to attn: [place] @ #[phone] & #[phone]  Above request completed and faxed to provided #'s. Patient made aware. Copy of paperwork scanned into patient's electronic medical record.  Ave Filter, RN

## 2016-01-05 NOTE — Progress Notes (Signed)
Type of request/paperwork being submitted for CHF clinic to complete: Disability Patient dropped off paperwork to CHF clinic office on: 01/04/2016 Patient requests completion of paperwork due by: 01/05/2016 Patient requests paperwork faxed/mailed/picked up: fax to attn: [place] @ #[phone] & #[phone]  Patient made aware of standard wait time of 10-14 business days for paperwork completion.  Ave Filter, RN

## 2016-01-08 DIAGNOSIS — R0683 Snoring: Secondary | ICD-10-CM

## 2016-01-08 NOTE — Procedures (Signed)
  Patient Name: Antonio Morgan, Antonio Morgan Date: 12/23/2015 Gender: Male D.O.B: 1981-02-16 Age (years): 4 Referring Provider: Tonye Becket NP Height (inches): 74 Interpreting Physician: Jetty Duhamel MD, ABSM Weight (lbs): 332 RPSGT: Shelah Lewandowsky BMI: 43 MRN: [id number] Neck Size: 20.00 CLINICAL INFORMATION Sleep Study Type: NPSG Indication for sleep study: Congestive Heart Failure, Hypertension, Morbid Obesity, Snoring, Witnesses Apnea / Gasping During Sleep Epworth Sleepiness Score: 4  SLEEP STUDY TECHNIQUE As per the AASM Manual for the Scoring of Sleep and Associated Events v2.3 (April 2016) with a hypopnea requiring 4% desaturations. The channels recorded and monitored were frontal, central and occipital EEG, electrooculogram (EOG), submentalis EMG (chin), nasal and oral airflow, thoracic and abdominal wall motion, anterior tibialis EMG, snore microphone, electrocardiogram, and pulse oximetry.  MEDICATIONS Patient's medications include: charted for review Medications self-administered by patient during sleep study : CARVEDILOL.  SLEEP ARCHITECTURE The study was initiated at 10:41:26 PM and ended at 4:47:19 AM. Sleep onset time was 18.3 minutes and the sleep efficiency was 80.7%. The total sleep time was 295.1 minutes. Stage REM latency was 166.5 minutes. The patient spent 22.57% of the night in stage N1 sleep, 58.80% in stage N2 sleep, 0.00% in stage N3 and 18.64% in REM. Alpha intrusion was absent. Supine sleep was 31.21%.  RESPIRATORY PARAMETERS The overall apnea/hypopnea index (AHI) was 68.9 per hour. There were 295 total apneas, including 265 obstructive, 6 central and 24 mixed apneas. There were 44 hypopneas and 26 RERAs. The AHI during Stage REM sleep was 91.6 per hour. AHI while supine was 70.4 per hour. The mean oxygen saturation was 88.78%. The minimum SpO2 during sleep was 61.00%. Soft snoring was noted during this study.  CARDIAC DATA The 2 lead EKG  demonstrated sinus rhythm. The mean heart rate was 90.27 beats per minute. Other EKG findings include:  frequent PVCs with bigeminy.  LEG MOVEMENT DATA The total PLMS were 0 with a resulting PLMS index of 0.00. Associated arousal with leg movement index was 0.0 .  IMPRESSIONS - Severe obstructive sleep apnea occurred during this study (AHI = 68.9/h). - No significant central sleep apnea occurred during this study (CAI = 1.2/h). - Severe oxygen desaturation was noted during this study (Min O2 = 61.00%). - The patient snored with Soft snoring volume. - EKG findings include frequent  PVCs with ventricular bigeminy. - Clinically significant periodic limb movements did not occur during sleep. No significant associated arousals.  DIAGNOSIS - Obstructive Sleep Apnea (327.23 [G47.33 ICD-10]) - Nocturnal Hypoxemia (327.26 [G47.36 ICD-10])  RECOMMENDATIONS - Therapeutic CPAP titration to determine optimal pressure required to alleviate sleep disordered breathing. - Avoid alcohol, sedatives and other CNS depressants that may worsen sleep apnea and disrupt normal sleep architecture. - Sleep hygiene should be reviewed to assess factors that may improve sleep quality. - Weight management and regular exercise should be initiated or continued if appropriate.  [Electronically signed] 01/08/2016 10:23 AM  Jetty Duhamel MD, ABSM Diplomate, American Board of Sleep Medicine   NPI: [id number]  Waymon Budge Diplomate, American Board of Sleep Medicine  ELECTRONICALLY SIGNED ON:  01/08/2016, 10:15 AM [facility] PH: [phone]   FX: [phone] ACCREDITED BY THE AMERICAN ACADEMY OF SLEEP MEDICINE

## 2016-01-09 ENCOUNTER — Telehealth: Payer: Self-pay | Admitting: Internal Medicine

## 2016-01-09 DIAGNOSIS — J9611 Chronic respiratory failure with hypoxia: Secondary | ICD-10-CM

## 2016-01-09 NOTE — Telephone Encounter (Signed)
Spoke with pt and he was under impression that POC eval would be ordered at his LOV. I do not see any noted indicating this in his chart.  MW - Please advise. Thanks!    LOV 12/28/15  Instructions   Goal is to keep your 02 at or above 94% at all times and always wear 2lpm at bedtime until advised otherwise  You do not appear to have any significant lung problem so no need for pulmonary medications and pulmonary follow up is as needed

## 2016-01-09 NOTE — Telephone Encounter (Signed)
Pt returning call.Antonio Morgan ° °

## 2016-01-09 NOTE — Telephone Encounter (Signed)
Order has been placed. Pt is aware and nothing further needed

## 2016-01-09 NOTE — Telephone Encounter (Signed)
Ok with me 

## 2016-01-09 NOTE — Telephone Encounter (Signed)
There is no mention in MW's OV note about pt getting POC.  LMTCB to clarify what he needs

## 2016-01-13 ENCOUNTER — Telehealth (HOSPITAL_COMMUNITY): Payer: Self-pay

## 2016-01-13 NOTE — Telephone Encounter (Signed)
Patient called for entresto samples until PA approved. Advised this should have already done.  Pharmacy confirms they have everything they need and patient able to get refills at this time.  Patient made aware.  Ave Filter, RN

## 2016-01-16 NOTE — Progress Notes (Signed)
PCP: Dr Juanetta GoslingHawkins  Primary Cardiologist:Dr Bensimhon   HPI: Antonio GerlachJason E Cornigansis a 35 y.o.male with a past medical history of HTN and obesity diagnosed with acute systolic heart failure august [id number].   [facility] ED on 12/08/2015 for worsening dyspnea over the last year.  On admit , he was tachycardiac into the low-100's. Oxygen saturations were initially 85%. CXR on admit showed vascular congestion. CTA was negative for PE. ECHO was performed and showed severely reduced LVEF 10-15% and RV moderately/severely dilated. Diuresed with IV lasix and transitioned to lasix 40 mg twice daily. RHC/LHC 12/13/2015 with normal cors and well compensated hemodynamics. Also discharged on 2 liters oxygen continuously. Discharge weight was 352 pounds.   Today he returns for HF follow up. Last visit K 5.3 and creatinine 1.33 so he was instructed to hold lasix for 2 days then cut back spirio to 12.5 mg daily. He says he continued to take 25 mg spiro daily. Says he uses Mrs [employer] daily. Overall feeling good. Denies SOB/PND/Orthopnea. Able to walk up steps. Walking over 15 minutes a day. Weight at home 334 pounds. Appetite ok. Tries to follow low salt diet. Limits his fluids < 2 liters per day. He continues on 3 liters. Taking all medications. Currently not working. 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     RHC/LHC 12/13/2015 RA = 6 RV = 44/8/10 PA = 43/17 (31) PCW = 13 Fick cardiac output/index = 7.6/2.8 PVR = 2.4 WU SVR = 855  Ao sat = 94%  PA sat = 67%, 70% Assessment: 1. Essentially normal coronaries 2. Well compensated hemodynamics with mild PAH  Labs  12/21/2015: K 5.3 Creatinine 1.33   ROS: All systems negative except as listed in HPI, PMH and Problem List.  SH:  Social History   Social History  . Marital status: Single    Spouse name: N/A  . Number of children: N/A  . Years of education: 3914   Occupational History  . guitarist    Social History Main Topics  . Smoking  status: Former Smoker    Packs/day: 1.00    Years: 10.00    Quit date: 04/23/2012  . Smokeless tobacco: Never Used  . Alcohol use No  . Drug use: No  . Sexual activity: Not on file   Other Topics Concern  . Not on file   Social History Narrative  . No narrative on file    FH:  Family History  Problem Relation Age of Onset  . Heart disease Father     heart transplant  . Hypertension Mother     Past Medical History:  Diagnosis Date  . Gout   . Hypertension   . Obese     Current Outpatient Prescriptions  Medication Sig Dispense Refill  . acetaminophen (TYLENOL) 325 MG tablet Take 650 mg by mouth every 6 (six) hours as needed for mild pain.     . carvedilol (COREG) 3.125 MG tablet Take 1 tablet (3.125 mg total) by mouth 2 (two) times daily with a meal. Take 3.125 mg (1 tab) in am and 6.25 mg (2 tabs) in pm. 90 tablet 6  . digoxin (LANOXIN) 0.25 MG tablet Take 1 tablet (0.25 mg total) by mouth daily. 30 tablet 6  . furosemide (LASIX) 40 MG tablet Take 1 tablet (40 mg total) by mouth daily. 30 tablet 6  . OXYGEN 3lpm 24/7  [facility]    . sacubitril-valsartan (ENTRESTO) 97-103 MG Take 1 tablet by mouth 2 (  two) times daily. 60 tablet 6  . spironolactone (ALDACTONE) 25 MG tablet Take 25 mg by mouth daily.    . colchicine 0.6 MG tablet Take 1 tablet (0.6 mg total) by mouth daily as needed (gout). (Patient not taking: Reported on 01/17/2016) 30 tablet 6   No current facility-administered medications for this encounter.     Vitals:   01/17/16 0932  BP: (!) 130/98  Pulse: (!) 108  SpO2: 94%  Weight: (!) 337 lb (152.9 kg)    PHYSICAL EXAM: General:  Well appearing. No resp difficulty. Ambulated in the clinic without difficulty.   HEENT: normal Neck: supple. JVP flat. Carotids 2+ bilaterally; no bruits. No lymphadenopathy or thryomegaly appreciated. Cor: PMI normal. Regular rate & rhythm. No rubs, gallops or murmurs. Lungs: clear on 2 liters oxygen.  Abdomen: obese, soft,  nontender, nondistended. No hepatosplenomegaly. No bruits or masses. Good bowel sounds. Extremities: no cyanosis, clubbing, rash, edema Neuro: alert & orientedx3, cranial nerves grossly intact. Moves all 4 extremities w/o difficulty. Affect pleasant.  ASSESSMENT & PLAN: 1. Chronic Systolic Heart Failure- NICM - cors ok on 8/182/117. ECHO 12/09/2015 EF 10-15%.  NYHA II. Functional improvement. Volume status stable. Continue  lasix to 40 mg daily. Continue 25  mg spiro daily. May need to cut back if potassium remains elevated.  Increase coreg to 6.25 mg twice daily x 2 weeks then he will increase carvedilol to 9.375 mg twice a day.  Digoxin 0.25 mg daily. Dig level  0.3  Continue entresto 97-103 twice a day.  Can consider hydralazine/imdur next visit.  Plan to repeat ECHO after HF meds optimized. Likely the end of November.  Today we reinforced daily weights, low salt diet, and limiting fluid intake to < 2 liters per day. 2. HTN- Stable  3. OSA- needs formal sleep study . Refer to pulmonary for sleep study.  4. Respiratory Failure- hypoxia--> discharged with oxygen 2 liters on exertion. Continue to maintain oxygen saturations.  5. Morbid Obesity - needs to lose weight. Discussed portion control. 6. Gout- continue colchicine as needed. 7. Hyperkalemia- last visit K 5.3 spiro was cut back to 12.5 mg daily however he continued a full 25 mg spiro.   Today I have asked him to increase activity to 30 minutes daily. BMET today. Follow up 4  weeks.   Jacori Mulrooney NP-C  10:45 AM

## 2016-01-17 ENCOUNTER — Ambulatory Visit (HOSPITAL_COMMUNITY)
Admission: RE | Admit: 2016-01-17 | Discharge: 2016-01-17 | Disposition: A | Discharge status: Home / Self Care | Payer: BC Managed Care – PPO | Source: Ambulatory Visit | Attending: Internal Medicine | Admitting: Internal Medicine

## 2016-01-17 ENCOUNTER — Telehealth (HOSPITAL_COMMUNITY): Payer: Self-pay | Admitting: Pharmacist

## 2016-01-17 VITALS — BP 130/98 | HR 108 | Wt 337.0 lb

## 2016-01-17 DIAGNOSIS — I10 Essential (primary) hypertension: Secondary | ICD-10-CM | POA: Diagnosis not present

## 2016-01-17 DIAGNOSIS — G4733 Obstructive sleep apnea (adult) (pediatric): Secondary | ICD-10-CM | POA: Insufficient documentation

## 2016-01-17 DIAGNOSIS — I5022 Chronic systolic (congestive) heart failure: Secondary | ICD-10-CM | POA: Insufficient documentation

## 2016-01-17 DIAGNOSIS — Z79899 Other long term (current) drug therapy: Secondary | ICD-10-CM | POA: Diagnosis not present

## 2016-01-17 DIAGNOSIS — J9691 Respiratory failure, unspecified with hypoxia: Secondary | ICD-10-CM | POA: Insufficient documentation

## 2016-01-17 DIAGNOSIS — Z87891 Personal history of nicotine dependence: Secondary | ICD-10-CM | POA: Insufficient documentation

## 2016-01-17 DIAGNOSIS — E875 Hyperkalemia: Secondary | ICD-10-CM | POA: Insufficient documentation

## 2016-01-17 DIAGNOSIS — J9611 Chronic respiratory failure with hypoxia: Secondary | ICD-10-CM | POA: Diagnosis not present

## 2016-01-17 DIAGNOSIS — I110 Hypertensive heart disease with heart failure: Secondary | ICD-10-CM | POA: Insufficient documentation

## 2016-01-17 DIAGNOSIS — I428 Other cardiomyopathies: Secondary | ICD-10-CM | POA: Insufficient documentation

## 2016-01-17 DIAGNOSIS — M109 Gout, unspecified: Secondary | ICD-10-CM | POA: Insufficient documentation

## 2016-01-17 LAB — BASIC METABOLIC PANEL
Anion gap: 9 (ref 5–15)
BUN: 17 mg/dL (ref 6–20)
CHLORIDE: 100 mmol/L — AB (ref 101–111)
CO2: 28 mmol/L (ref 22–32)
CREATININE: 1.3 mg/dL — AB (ref 0.61–1.24)
Calcium: 10 mg/dL (ref 8.9–10.3)
GFR calc Af Amer: >60 mL/min (ref >60)
GFR calc non Af Amer: >60 mL/min (ref >60)
GLUCOSE: 164 mg/dL — AB (ref 65–99)
POTASSIUM: 4.4 mmol/L (ref 3.5–5.1)
Sodium: 137 mmol/L (ref 135–145)

## 2016-01-17 MED ORDER — CARVEDILOL 6.25 MG PO TABS: 6.2500 mg | ORAL_TABLET | Freq: Two times a day (BID) | ORAL | 0 refills | Status: DC

## 2016-01-17 MED ORDER — CARVEDILOL 6.25 MG PO TABS: 9.3750 mg | ORAL_TABLET | Freq: Two times a day (BID) | ORAL | 3 refills | Status: DC

## 2016-01-17 NOTE — Progress Notes (Signed)
Advanced Heart Failure Medication Review by a Pharmacist  Does the patient  feel that his/her medications are working for him/her?  yes  Has the patient been experiencing any side effects to the medications prescribed?  no  Does the patient measure his/her own blood pressure or blood glucose at home?  no   Does the patient have any problems obtaining medications due to transportation or finances?   No - now has active [employer] commercial   Understanding of regimen: good Understanding of indications: good Potential of compliance: good Patient understands to avoid NSAIDs. Patient understands to avoid decongestants.  Issues to address at subsequent visits: None   Pharmacist comments:  Mr. Antonio Morgan is a pleasant 35 yo M presenting with his girlfriend and without a medication list but with good recall of his regimen. He reports good compliance with his regimen but does state that he has been taking spironolactone 25 mg daily instead of the previously prescribed 12.5 mg daily. He did not have any other medication-related questions or concerns for me at this time.   [name]. Bonnye Fava, PharmD, BCPS, CPP Clinical Pharmacist Pager: [phone] Phone: [phone] 01/17/2016 9:48 AM      Time with patient: 10 minutes Preparation and documentation time: 2 minutes Total time: 12 minutes

## 2016-01-17 NOTE — Patient Instructions (Addendum)
Routine lab work today. Will notify you of abnormal results, otherwise no news is good news!  Change Carvedilol (Coreg) as follows: 01/18/2016-01/31/2016: Increase to 6.25 mg twice daily. 02/01/2016 ongoing: Increase to 9.375 mg twice daily. There will be two separate prescriptions to pick up for these doses to avoid confusion.  Follow up 4 weeks with Amy Clegg NP-C.  Do the following things EVERYDAY: 1) Weigh yourself in the morning before breakfast. Write it down and keep it in a log. 2) Take your medicines as prescribed 3) Eat low salt foods-Limit salt (sodium) to 2000 mg per day.  4) Stay as active as you can everyday 5) Limit all fluids for the day to less than 2 liters

## 2016-01-17 NOTE — Telephone Encounter (Signed)
Entresto 97-103 mg BID PA approved by [facility] [employer] through 01/11/17.   [name]. Bonnye Fava, PharmD, BCPS, CPP Clinical Pharmacist Pager: [phone] Phone: [phone] 01/17/2016 12:21 PM

## 2016-01-26 ENCOUNTER — Encounter (HOSPITAL_COMMUNITY): Payer: BC Managed Care – PPO

## 2016-02-11 ENCOUNTER — Telehealth: Payer: Self-pay | Admitting: Internal Medicine

## 2016-02-11 MED ORDER — PREDNISONE 20 MG PO TABS: 40.0000 mg | ORAL_TABLET | Freq: Every day | ORAL | 0 refills | Status: AC

## 2016-02-11 NOTE — Telephone Encounter (Signed)
Received call as cardiology MD on call regarding progressive gout flare over the past several days refractory to prn colchicine.  Patient's weight has dropped significantly due to healthy habits.  Unclear if being over diuresed driving gout flare.  Has cards appointment early next week.  I called in a short prednisone course (pt with CKD so no NSAIDs and no DM history) (40 mg for 4 days) to bridge him to his next appointment.  Will forward to Bensimhon/Clegg  Cleta Alberts, MD 02/11/16 06:32 P

## 2016-02-14 ENCOUNTER — Ambulatory Visit (HOSPITAL_COMMUNITY)
Admission: RE | Admit: 2016-02-14 | Discharge: 2016-02-14 | Disposition: A | Discharge status: Home / Self Care | Payer: BC Managed Care – PPO | Source: Ambulatory Visit | Attending: Cardiology | Admitting: Cardiology

## 2016-02-14 VITALS — BP 152/96 | HR 98 | Wt 332.6 lb

## 2016-02-14 DIAGNOSIS — I429 Cardiomyopathy, unspecified: Secondary | ICD-10-CM | POA: Insufficient documentation

## 2016-02-14 DIAGNOSIS — E875 Hyperkalemia: Secondary | ICD-10-CM | POA: Diagnosis not present

## 2016-02-14 DIAGNOSIS — I5022 Chronic systolic (congestive) heart failure: Secondary | ICD-10-CM | POA: Diagnosis not present

## 2016-02-14 DIAGNOSIS — G4733 Obstructive sleep apnea (adult) (pediatric): Secondary | ICD-10-CM

## 2016-02-14 DIAGNOSIS — M109 Gout, unspecified: Secondary | ICD-10-CM | POA: Insufficient documentation

## 2016-02-14 DIAGNOSIS — I110 Hypertensive heart disease with heart failure: Secondary | ICD-10-CM | POA: Insufficient documentation

## 2016-02-14 DIAGNOSIS — Z87891 Personal history of nicotine dependence: Secondary | ICD-10-CM | POA: Diagnosis not present

## 2016-02-14 DIAGNOSIS — Z8249 Family history of ischemic heart disease and other diseases of the circulatory system: Secondary | ICD-10-CM | POA: Insufficient documentation

## 2016-02-14 DIAGNOSIS — J9691 Respiratory failure, unspecified with hypoxia: Secondary | ICD-10-CM | POA: Insufficient documentation

## 2016-02-14 DIAGNOSIS — Z9981 Dependence on supplemental oxygen: Secondary | ICD-10-CM | POA: Diagnosis not present

## 2016-02-14 DIAGNOSIS — Z6841 Body Mass Index (BMI) 40.0 and over, adult: Secondary | ICD-10-CM | POA: Diagnosis not present

## 2016-02-14 DIAGNOSIS — I10 Essential (primary) hypertension: Secondary | ICD-10-CM

## 2016-02-14 MED ORDER — SPIRONOLACTONE 25 MG PO TABS: 25.0000 mg | ORAL_TABLET | Freq: Every day | ORAL | 6 refills | Status: DC

## 2016-02-14 MED ORDER — CARVEDILOL 12.5 MG PO TABS: 12.5000 mg | ORAL_TABLET | Freq: Two times a day (BID) | ORAL | 3 refills | Status: DC

## 2016-02-14 MED ORDER — ISOSORB DINITRATE-HYDRALAZINE 20-37.5 MG PO TABS: 1.0000 | ORAL_TABLET | Freq: Three times a day (TID) | ORAL | 3 refills | Status: DC

## 2016-02-14 MED ORDER — COLCHICINE 0.6 MG PO TABS: 0.6000 mg | ORAL_TABLET | Freq: Every day | ORAL | 6 refills | Status: DC | PRN

## 2016-02-14 MED FILL — BIDIL TABLET: 20-37.5 | 30 days supply | Qty: 90 | Fill #0

## 2016-02-14 MED FILL — CARVEDILOL 12.5 MG TABLET: 12.5 | 30 days supply | Qty: 60 | Fill #0

## 2016-02-14 MED FILL — SPIRONOLACTONE 25 MG TABLET: 25 | 30 days supply | Qty: 30 | Fill #0

## 2016-02-14 NOTE — Progress Notes (Signed)
Advanced Heart Failure Medication Review by a Pharmacist  Does the patient  feel that his/her medications are working for him/her?  yes  Has the patient been experiencing any side effects to the medications prescribed?  no  Does the patient measure his/her own blood pressure or blood glucose at home?  yes   Does the patient have any problems obtaining medications due to transportation or finances?   no  Understanding of regimen: good Understanding of indications: good Potential of compliance: good Patient understands to avoid NSAIDs. Patient understands to avoid decongestants.  Issues to address at subsequent visits: None   Pharmacist comments:  Mr. Carrano is a pleasant 35 yo M presenting with his girlfriend and without a medication list. He reports good compliance with his regimen but did start a short course of prednisone for gout on Sunday. He did not have any specific medication-related questions or concerns for me at this time.   [name]. Bonnye Fava, PharmD, BCPS, CPP Clinical Pharmacist Pager: [phone] Phone: [phone] 02/14/2016 9:47 AM      Time with patient: 10 minutes Preparation and documentation time: 2 minutes Total time: 12 minutes

## 2016-02-14 NOTE — Patient Instructions (Signed)
Increase Carvedilol to 12.5 mg Twice daily   Start Bidil 1 tab Three times a day   You have been referred to Dr Mayford Knife for sleep apnea  Your physician recommends that you schedule a follow-up appointment in: 2 weeks

## 2016-02-14 NOTE — Progress Notes (Addendum)
PCP: Dr Juanetta Gosling  Primary Cardiologist:Dr Bensimhon   HPI: Antonio Morgan a 35 y.o.male with a past medical history of HTN and obesity diagnosed with acute systolic heart failure august 9562.   He presented to [facility] ED on 12/08/2015 for worsening dyspnea over the last year.  On admit , he was tachycardiac into the low-100's. Oxygen saturations were initially 85%. CXR on admit showed vascular congestion. CTA was negative for PE. ECHO was performed and showed severely reduced LVEF 10-15% and RV moderately/severely dilated. Diuresed with IV lasix and transitioned to lasix 40 mg twice daily. RHC/LHC 12/13/2015 with normal cors and well compensated hemodynamics. Also discharged on 2 liters oxygen continuously. Discharge weight was 352 pounds.   Today he returns for HF follow up. Last visit carvedilol was increased to 9.375 mg twice a day. Completing prednisone burst for gout flare. Overall feeling much better. Denies SOB/PND/Orthopnea.  Weight at home has been trending down to 327 pounds. Tries to follow low salt diet. Limits his fluids < 2 liters per day. Taking all medications. Currently not working. 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     ECHO 12/09/2015 10-15% RV mod-severely dilated.   RHC/LHC 12/13/2015 RA = 6 RV = 44/8/10 PA = 43/17 (31) PCW = 13 Fick cardiac output/index = 7.6/2.8 PVR = 2.4 WU SVR = 855  Ao sat = 94%  PA sat = 67%, 70% Assessment: 1. Essentially normal coronaries 2. Well compensated hemodynamics with mild PAH  Labs  12/21/2015: K 5.3 Creatinine 1.33 01/17/2016: K 4.4 Creatinine 1.3    ROS: All systems negative except as listed in HPI, PMH and Problem List.  SH:  Social History   Social History  . Marital status: Single    Spouse name: N/A  . Number of children: N/A  . Years of education: 35   Occupational History  . guitarist    Social History Main Topics  . Smoking status: Former Smoker    Packs/day: 1.00    Years: 10.00    Quit  date: 04/23/2012  . Smokeless tobacco: Never Used  . Alcohol use No  . Drug use: No  . Sexual activity: Not on file   Other Topics Concern  . Not on file   Social History Narrative  . No narrative on file    FH:  Family History  Problem Relation Age of Onset  . Heart disease Father     heart transplant  . Hypertension Mother     Past Medical History:  Diagnosis Date  . Gout   . Hypertension   . Obese     Current Outpatient Prescriptions  Medication Sig Dispense Refill  . acetaminophen (TYLENOL) 325 MG tablet Take 650 mg by mouth every 6 (six) hours as needed for mild pain.     . carvedilol (COREG) 6.25 MG tablet Take 1.5 tablets (9.375 mg total) by mouth 2 (two) times daily. 90 tablet 3  . colchicine 0.6 MG tablet Take 1 tablet (0.6 mg total) by mouth daily as needed (gout). 30 tablet 6  . digoxin (LANOXIN) 0.25 MG tablet Take 1 tablet (0.25 mg total) by mouth daily. 30 tablet 6  . furosemide (LASIX) 40 MG tablet Take 1 tablet (40 mg total) by mouth daily. 30 tablet 6  . OXYGEN 3lpm 24/7  AHC    . predniSONE (DELTASONE) 20 MG tablet Take 2 tablets (40 mg total) by mouth daily with breakfast. 8 tablet 0  . sacubitril-valsartan (ENTRESTO) 97-103 MG Take 1  tablet by mouth 2 (two) times daily. 60 tablet 6  . spironolactone (ALDACTONE) 25 MG tablet Take 25 mg by mouth daily.     No current facility-administered medications for this encounter.     Vitals:   02/14/16 0923  BP: (!) 152/96  Pulse: 98  SpO2: 94%  Weight: (!) 332 lb 9.6 oz (150.9 kg)    PHYSICAL EXAM: General:  Well appearing. No resp difficulty. Ambulated in the clinic without difficulty.  Girlfriend present  HEENT: normal Neck: supple. JVP flat. Carotids 2+ bilaterally; no bruits. No lymphadenopathy or thryomegaly appreciated. Cor: PMI normal. Regular rate & rhythm. No rubs, gallops or murmurs. Lungs: clear on 2 liters oxygen.  Abdomen: obese, soft, nontender, nondistended. No hepatosplenomegaly. No  bruits or masses. Good bowel sounds. Extremities: no cyanosis, clubbing, rash, edema Neuro: alert & orientedx3, cranial nerves grossly intact. Moves all 4 extremities w/o difficulty. Affect pleasant.  ASSESSMENT & PLAN: 1. Chronic Systolic Heart Failure- NICM - cors ok on 12/09/2015. ECHO 12/09/2015 EF 10-15%.  NYHA II. Functional improvement. Volume status stable. Continue  lasix to 40 mg daily. Continue 25  mg spiro daily. Increase coreg 12.5 mg twice a day.   Digoxin 0.25 mg daily. Dig level  0.3  Continue entresto 97-103 twice a day.  Add bidil 1 tab three times daily.  Plan to repeat ECHO after HF meds optimized. Likely the end of November.  Today we reinforced daily weights, low salt diet, and limiting fluid intake to < 2 liters per day. 2. HTN- Elevated.  3. OSA-Completed sleep study and needs CPAP. I will refer him to Dr Mayford Knifeurner for CPAP titration. 4. Respiratory Failure- hypoxia--> discharged with oxygen 2 liters on exertion. Continue to maintain oxygen saturations.  5. Morbid Obesity - needs to lose weight. Discussed portion control. 6. Gout- continue colchicine as needed. On prednisone burst.  7. Hyperkalemia- 4.4    Follow up in 2 weeks.      Amy Clegg NP-C  9:44 AM

## 2016-02-18 ENCOUNTER — Telehealth: Payer: Self-pay | Admitting: Physician Assistant

## 2016-02-18 NOTE — Telephone Encounter (Signed)
Patient's girlfriend called if there are angina service for recommendation regarding potential side effect from biting.  According to her, since the patient started on BiDil, he has noticed increased angle edema and also felt his heartbeat more.  I'm not entirely sure his increased ankle edema is related to BiDil, otherwise it does not appear patient has significant dizziness, chest discomfort or significant shortness of breath.  Given the fact that he does not have other significant side effect, I am more inclined to have him continue on the BiDil given his low ejection fraction.  He will monitor salt intake, fluid intake, and weigh himself on a daily basis.  Note, patient has not been weighing himself at home.  And does not know his baseline dry weight.  I have advised the patient to continue on BiDil and monitoring his symptoms.  If his symptoms does get worse, he will need to call cardiology service, and the also inform us his weight change to see if he is actually having swelling on BiDil versus having swelling because of increased need of diuretic before any changes can be made.  Ramond Dial PA Pager: [phone]

## 2016-02-21 ENCOUNTER — Telehealth (HOSPITAL_COMMUNITY): Payer: Self-pay | Admitting: *Deleted

## 2016-02-21 NOTE — Telephone Encounter (Signed)
Pt states he is currently out of work due to his HF and received a summons for jury duty.  He states he spoke w/[employer] and they advised since he is out of work at this time if we send in a letter they will excuse him this time.  Advised will send to Dr Gala Romney for review, he states letter needs to be faxed to Bary Castilla at [phone].

## 2016-02-22 ENCOUNTER — Ambulatory Visit: Payer: BC Managed Care – PPO | Admitting: Cardiology

## 2016-02-27 ENCOUNTER — Encounter: Payer: Self-pay | Admitting: Cardiology

## 2016-02-27 MED FILL — COLCHICINE 0.6 MG TABLET: 0.6 | 30 days supply | Qty: 30 | Fill #0 | Status: TO

## 2016-02-28 ENCOUNTER — Ambulatory Visit (HOSPITAL_COMMUNITY)
Admission: RE | Admit: 2016-02-28 | Discharge: 2016-02-28 | Disposition: A | Discharge status: Home / Self Care | Payer: BC Managed Care – PPO | Source: Ambulatory Visit | Attending: Cardiology | Admitting: Cardiology

## 2016-02-28 VITALS — BP 142/88 | HR 98 | Wt 332.4 lb

## 2016-02-28 DIAGNOSIS — I110 Hypertensive heart disease with heart failure: Secondary | ICD-10-CM | POA: Insufficient documentation

## 2016-02-28 DIAGNOSIS — J9691 Respiratory failure, unspecified with hypoxia: Secondary | ICD-10-CM | POA: Insufficient documentation

## 2016-02-28 DIAGNOSIS — Z87891 Personal history of nicotine dependence: Secondary | ICD-10-CM | POA: Diagnosis not present

## 2016-02-28 DIAGNOSIS — Z8249 Family history of ischemic heart disease and other diseases of the circulatory system: Secondary | ICD-10-CM | POA: Insufficient documentation

## 2016-02-28 DIAGNOSIS — G4733 Obstructive sleep apnea (adult) (pediatric): Secondary | ICD-10-CM

## 2016-02-28 DIAGNOSIS — E875 Hyperkalemia: Secondary | ICD-10-CM | POA: Diagnosis not present

## 2016-02-28 DIAGNOSIS — M109 Gout, unspecified: Secondary | ICD-10-CM | POA: Diagnosis not present

## 2016-02-28 DIAGNOSIS — I10 Essential (primary) hypertension: Secondary | ICD-10-CM | POA: Diagnosis not present

## 2016-02-28 DIAGNOSIS — Z9981 Dependence on supplemental oxygen: Secondary | ICD-10-CM | POA: Diagnosis not present

## 2016-02-28 DIAGNOSIS — Z6841 Body Mass Index (BMI) 40.0 and over, adult: Secondary | ICD-10-CM | POA: Diagnosis not present

## 2016-02-28 DIAGNOSIS — I5022 Chronic systolic (congestive) heart failure: Secondary | ICD-10-CM | POA: Diagnosis present

## 2016-02-28 DIAGNOSIS — I429 Cardiomyopathy, unspecified: Secondary | ICD-10-CM | POA: Diagnosis not present

## 2016-02-28 MED ORDER — ISOSORB DINITRATE-HYDRALAZINE 20-37.5 MG PO TABS: 2.0000 | ORAL_TABLET | Freq: Three times a day (TID) | ORAL | 3 refills | Status: DC

## 2016-02-28 MED ORDER — CARVEDILOL 12.5 MG PO TABS: 18.7500 mg | ORAL_TABLET | Freq: Two times a day (BID) | ORAL | 3 refills | Status: DC

## 2016-02-28 NOTE — Progress Notes (Signed)
PCP: Dr Juanetta Gosling  Primary Cardiologist:Dr Bensimhon   HPI: Antonio Morgan a 35 y.o.male with a past medical history of HTN and obesity diagnosed with acute systolic heart failure august 7035.   He presented to [facility] ED on 12/08/2015 for worsening dyspnea over the last year.  On admit , he was tachycardiac into the low-100's. Oxygen saturations were initially 85%. CXR on admit showed vascular congestion. CTA was negative for PE. ECHO was performed and showed severely reduced LVEF 10-15% and RV moderately/severely dilated. Diuresed with IV lasix and transitioned to lasix 40 mg twice daily. RHC/LHC 12/13/2015 with normal cors and well compensated hemodynamics. Also discharged on 2 liters oxygen continuously. Discharge weight was 352 pounds.   Today he returns for HF follow up with his girlfriend. Last visit bidil added and carvedilol was increased. Overall feeling much better. Denies SOB/PND/Orthopnea. Weight at home 327 pounds. Waiting for CPAP titration. Able to walk around the grocery store without difficulty. Appetite ok. Following low salt diet. Currently not working. 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    ECHO 12/09/2015 10-15% RV mod-severely dilated.   RHC/LHC 12/13/2015 RA = 6 RV = 44/8/10 PA = 43/17 (31) PCW = 13 Fick cardiac output/index = 7.6/2.8 PVR = 2.4 WU SVR = 855  Ao sat = 94%  PA sat = 67%, 70% Assessment: 1. Essentially normal coronaries 2. Well compensated hemodynamics with mild PAH  Labs  12/21/2015: K 5.3 Creatinine 1.33 01/17/2016: K 4.4 Creatinine 1.3    ROS: All systems negative except as listed in HPI, PMH and Problem List.  SH:  Social History   Social History  . Marital status: Single    Spouse name: N/A  . Number of children: N/A  . Years of education: 63   Occupational History  . guitarist    Social History Main Topics  . Smoking status: Former Smoker    Packs/day: 1.00    Years: 10.00    Quit date: 04/23/2012  . Smokeless  tobacco: Never Used  . Alcohol use No  . Drug use: No  . Sexual activity: Not on file   Other Topics Concern  . Not on file   Social History Narrative  . No narrative on file    FH:  Family History  Problem Relation Age of Onset  . Heart disease Father     heart transplant  . Hypertension Mother     Past Medical History:  Diagnosis Date  . Gout   . Hypertension   . Obese     Current Outpatient Prescriptions  Medication Sig Dispense Refill  . acetaminophen (TYLENOL) 325 MG tablet Take 650 mg by mouth every 6 (six) hours as needed for mild pain.     . carvedilol (COREG) 12.5 MG tablet Take 1 tablet (12.5 mg total) by mouth 2 (two) times daily. 60 tablet 3  . colchicine 0.6 MG tablet Take 1 tablet (0.6 mg total) by mouth daily as needed (gout). 30 tablet 6  . digoxin (LANOXIN) 0.25 MG tablet Take 1 tablet (0.25 mg total) by mouth daily. 30 tablet 6  . furosemide (LASIX) 40 MG tablet Take 1 tablet (40 mg total) by mouth daily. 30 tablet 6  . isosorbide-hydrALAZINE (BIDIL) 20-37.5 MG tablet Take 1 tablet by mouth 3 (three) times daily. 90 tablet 3  . OXYGEN 3lpm 24/7  A[facility]    . sacubitril-valsartan (ENTRESTO) 97-103 MG Take 1 tablet by mouth 2 (two) times daily. 60 tablet 6  . spironolactone (  ALDACTONE) 25 MG tablet Take 1 tablet (25 mg total) by mouth daily. 30 tablet 6   No current facility-administered medications for this encounter.     Vitals:   02/28/16 0930  BP: (!) 142/88  Pulse: (!) 115  SpO2: 96%  Weight: (!) 332 lb 6.4 oz (150.8 kg)    PHYSICAL EXAM: General:  Well appearing. No resp difficulty. Ambulated in the clinic without difficulty.  Girlfriend present  HEENT: normal Neck: supple. JVP flat. Carotids 2+ bilaterally; no bruits. No lymphadenopathy or thryomegaly appreciated. Cor: PMI normal. Regular rate & rhythm. No rubs, gallops or murmurs. Lungs: clear on room air.  Abdomen: obese, soft, nontender, nondistended. No hepatosplenomegaly. No bruits or  masses. Good bowel sounds. Extremities: no cyanosis, clubbing, rash, edema Neuro: alert & orientedx3, cranial nerves grossly intact. Moves all 4 extremities w/o difficulty. Affect pleasant.  ASSESSMENT & PLAN: 1. Chronic Systolic Heart Failure- NICM - cors ok on 12/09/2015. ECHO 12/09/2015 EF 10-15%.  NYHA II. Doing great.  Volume status stable. Continue  lasix 40 mg daily. Continue 25  mg spiro daily. Increase coreg 18.75 mg twice a day.   Digoxin 0.25 mg daily. Dig level  0.3  Continue entresto 97-103 twice a day.  Increase bidil 2 tab three times daily.  Plan to repeat ECHO next visit.  Will need to consider ICD if EF remains low.  Today we reinforced daily weights, low salt diet, and limiting fluid intake to < 2 liters per day. 2. HTN- Improved.  3. OSA-Completed sleep study and needs CPAP. Referred to Dr Mayford Knifeurner for CPAP titration.  4. Respiratory Failure- hypoxia--> discharged with oxygen 2 liters on exertion. Continue to maintain oxygen saturations.  5. Morbid Obesity - needs to lose weight. Discussed portion control. 6. Gout- continue colchicine as needed.  7. Hyperkalemia- Most recent K 4.4 Check BMET next visit.   Sleep study pending.    Follow up in  3 weeks with an ECHO and Dr Gala RomneyBensimhon.Tonye Becket.     Makilah Dowda NP-C  9:46 AM

## 2016-02-28 NOTE — Patient Instructions (Signed)
INCREASE Carvedilol (Coreg) to 18.75 mg (1.5 tabs) twice daily.  INCREASE Bidil to 2 tabs three times daily.  Follow up with echocardiogram and appointment with Dr. Suella Grove in 3 weeks.  Do the following things EVERYDAY: 1) Weigh yourself in the morning before breakfast. Write it down and keep it in a log. 2) Take your medicines as prescribed 3) Eat low salt foods-Limit salt (sodium) to 2000 mg per day.  4) Stay as active as you can everyday 5) Limit all fluids for the day to less than 2 liters

## 2016-02-28 NOTE — Progress Notes (Signed)
Advanced Heart Failure Medication Review by a Pharmacist  Does the patient  feel that his/her medications are working for him/her?  yes  Has the patient been experiencing any side effects to the medications prescribed?  no  Does the patient measure his/her own blood pressure or blood glucose at home?  yes   Does the patient have any problems obtaining medications due to transportation or finances?   no  Understanding of regimen: good Understanding of indications: good Potential of compliance: good Patient understands to avoid NSAIDs. Patient understands to avoid decongestants.  Issues to address at subsequent visits: None   Pharmacist comments:  Antonio Morgan is a pleasant 35 yo M presenting with his girlfriend and without a medication list. He reports good compliance with his regimen. He did state that he still has gout in his right hand for which he has started taking colchicine. He was told by the pharmacist at [facility]patient [facility] that there was an interaction with this and carvedilol. This is a PGP interaction and I have asked him to try 0.3 mg daily of the colchicine instead of 0.6 mg daily since the carvedilol may increase serum concentrations. I have asked him to look out for side effects including diarrhea, nausea/vomiting.   Antonio Morgan. Antonio Morgan, PharmD, BCPS, CPP Clinical Pharmacist Pager: [phone] Phone: [phone] 02/28/2016 9:52 AM      Time with patient: 10 minutes Preparation and documentation time: 2 minutes Total time: 12 minutes

## 2016-03-06 MED FILL — CARVEDILOL 12.5 MG TABLET: 12.5 | 30 days supply | Qty: 90 | Fill #0

## 2016-03-06 MED FILL — BIDIL TABLET: 20-37.5 | 30 days supply | Qty: 180 | Fill #0

## 2016-03-07 ENCOUNTER — Telehealth (HOSPITAL_COMMUNITY): Payer: Self-pay | Admitting: Pharmacist

## 2016-03-07 NOTE — Telephone Encounter (Signed)
Mr. Tack called stating his Bidil is $47/mo. And was wondering if there was a cheaper alternative. If he uses an [facility] pharmacy (closest to him is [place] in [place]), he should be able to get it for $30/mo. He just picked up a month's worth and would like to hold off on sending it to that pharmacy for the time being.   [name]. Bonnye Fava, PharmD, BCPS, CPP Clinical Pharmacist Pager: [phone] Phone: [phone] 03/07/2016 10:20 AM

## 2016-03-23 ENCOUNTER — Encounter (HOSPITAL_COMMUNITY): Payer: Self-pay | Admitting: Internal Medicine

## 2016-03-23 ENCOUNTER — Other Ambulatory Visit (HOSPITAL_COMMUNITY): Payer: Self-pay | Admitting: *Deleted

## 2016-03-23 ENCOUNTER — Ambulatory Visit (HOSPITAL_COMMUNITY)
Admission: RE | Admit: 2016-03-23 | Discharge: 2016-03-23 | Disposition: A | Discharge status: Home / Self Care | Payer: BC Managed Care – PPO | Source: Ambulatory Visit | Attending: Pulmonary Disease | Admitting: Pulmonary Disease

## 2016-03-23 ENCOUNTER — Ambulatory Visit (HOSPITAL_BASED_OUTPATIENT_CLINIC_OR_DEPARTMENT_OTHER)
Admission: RE | Admit: 2016-03-23 | Discharge: 2016-03-23 | Disposition: A | Discharge status: Home / Self Care | Payer: BC Managed Care – PPO | Source: Ambulatory Visit | Attending: Internal Medicine | Admitting: Internal Medicine

## 2016-03-23 VITALS — BP 138/76 | HR 92 | Wt 307.5 lb

## 2016-03-23 DIAGNOSIS — J9691 Respiratory failure, unspecified with hypoxia: Secondary | ICD-10-CM | POA: Diagnosis not present

## 2016-03-23 DIAGNOSIS — M109 Gout, unspecified: Secondary | ICD-10-CM | POA: Insufficient documentation

## 2016-03-23 DIAGNOSIS — G4733 Obstructive sleep apnea (adult) (pediatric): Secondary | ICD-10-CM | POA: Diagnosis not present

## 2016-03-23 DIAGNOSIS — Z9981 Dependence on supplemental oxygen: Secondary | ICD-10-CM | POA: Diagnosis not present

## 2016-03-23 DIAGNOSIS — Z87891 Personal history of nicotine dependence: Secondary | ICD-10-CM | POA: Insufficient documentation

## 2016-03-23 DIAGNOSIS — I110 Hypertensive heart disease with heart failure: Secondary | ICD-10-CM | POA: Insufficient documentation

## 2016-03-23 DIAGNOSIS — I429 Cardiomyopathy, unspecified: Secondary | ICD-10-CM | POA: Insufficient documentation

## 2016-03-23 DIAGNOSIS — E875 Hyperkalemia: Secondary | ICD-10-CM | POA: Diagnosis not present

## 2016-03-23 DIAGNOSIS — I5022 Chronic systolic (congestive) heart failure: Secondary | ICD-10-CM

## 2016-03-23 DIAGNOSIS — M1000 Idiopathic gout, unspecified site: Secondary | ICD-10-CM | POA: Diagnosis not present

## 2016-03-23 DIAGNOSIS — Z6841 Body Mass Index (BMI) 40.0 and over, adult: Secondary | ICD-10-CM | POA: Diagnosis not present

## 2016-03-23 LAB — ECHOCARDIOGRAM COMPLETE
CHL CUP DOP CALC LVOT VTI: 17.8 cm
CHL CUP MV DEC (S): 169
E/e' ratio: 7.05
EWDT: 169 ms
FS: 21 % — AB (ref 28–44)
IVS/LV PW RATIO, ED: 0.9
LA ID, A-P, ES: 52 mm
LADIAMINDEX: 1.94 cm/m2
LAVOL: 46.3 mL
LAVOLA4C: 56 mL
LAVOLIN: 17.3 mL/m2
LDCA: 6.16 cm2
LEFT ATRIUM END SYS DIAM: 52 mm
LV E/e' medial: 7.05
LV PW d: 14.2 mm — AB (ref 0.6–1.1)
LV TDI E'LATERAL: 9.36
LVEEAVG: 7.05
LVELAT: 9.36 cm/s
LVOT SV: 110 mL
LVOT diameter: 28 mm
LVOT peak vel: 106 cm/s
MV pk A vel: 91.7 m/s
MVPKEVEL: 66 m/s
TDI e' medial: 6.53

## 2016-03-23 LAB — BASIC METABOLIC PANEL
Anion gap: 9 (ref 5–15)
BUN: 7 mg/dL (ref 6–20)
CO2: 28 mmol/L (ref 22–32)
CREATININE: 0.93 mg/dL (ref 0.61–1.24)
Calcium: 9.9 mg/dL (ref 8.9–10.3)
Chloride: 100 mmol/L — ABNORMAL LOW (ref 101–111)
Glucose, Bld: 114 mg/dL — ABNORMAL HIGH (ref 65–99)
Potassium: 3.8 mmol/L (ref 3.5–5.1)
SODIUM: 137 mmol/L (ref 135–145)

## 2016-03-23 MED ORDER — CARVEDILOL 25 MG PO TABS: 25.0000 mg | ORAL_TABLET | Freq: Two times a day (BID) | ORAL | 3 refills | Status: DC

## 2016-03-23 MED ORDER — ALLOPURINOL 100 MG PO TABS: 200.0000 mg | ORAL_TABLET | Freq: Every day | ORAL | 3 refills | Status: DC

## 2016-03-23 MED ORDER — PERFLUTREN LIPID MICROSPHERE
1.0000 mL | INTRAVENOUS | Status: AC | PRN
  Administered 2016-03-23: 2 mL via INTRAVENOUS
  Filled 2016-03-23: qty 10

## 2016-03-23 MED ORDER — PREDNISONE 20 MG PO TABS: ORAL_TABLET | ORAL | 1 refills | Status: DC

## 2016-03-23 MED FILL — predniSONE 20 MG TABS: 20 | 2 days supply | Qty: 4 | Fill #0

## 2016-03-23 MED FILL — CARVEDILOL 25 MG TABLET: 25 | 30 days supply | Qty: 60 | Fill #0

## 2016-03-23 MED FILL — ALLOPURINOL 100 MG TABLET: 100 | 30 days supply | Qty: 60 | Fill #0

## 2016-03-23 NOTE — Progress Notes (Signed)
PCP: Dr Juanetta Gosling  Primary Cardiologist:Dr Bensimhon   HPI: Antonio Rives Cornigansis a 35y.o.male with a past medical history of HTN and obesity diagnosed with acute systolic heart failure august 1610.   He presented to [facility] ED on 12/08/2015 for worsening dyspnea over the last year.  On admit , he was tachycardiac into the low-100's. Oxygen saturations were initially 85%. CXR on admit showed vascular congestion. CTA was negative for PE. ECHO was performed and showed severely reduced LVEF 10-15% and RV moderately/severely dilated. Diuresed with IV lasix and transitioned to lasix 40 mg twice daily. RHC/LHC 12/13/2015 with normal cors and well compensated hemodynamics. Also discharged on 2 liters oxygen continuously. Discharge weight was 352 pounds.   Today he returns for HF follow up with his girlfriend. Last visit bidil was increased to goal dose and carvedilol was increased to 18.75 mg twice a day. . Overall feeling much better. Complaining of gout pain in R wrist.  Denies SOB/PND/Orthopnea. Weight at home down to 314 pounds at home.  Waiting for CPAP titration and has follow up January 11th. . Able to walk around the grocery store without difficulty. Appetite ok. Following low salt diet. Currently not working.  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    ECHO 12/09/2015 10-15% RV mod-severely dilated.  ECHO 03/23/2016 EF 35-40%   RHC/LHC 12/13/2015 RA = 6 RV = 44/8/10 PA = 43/17 (31) PCW = 13 Fick cardiac output/index = 7.6/2.8 PVR = 2.4 WU SVR = 855  Ao sat = 94%  PA sat = 67%, 70% Assessment: 1. Essentially normal coronaries 2. Well compensated hemodynamics with mild PAH  Labs  12/21/2015: K 5.3 Creatinine 1.33 01/17/2016: K 4.4 Creatinine 1.3    ROS: All systems negative except as listed in HPI, PMH and Problem List.  SH:  Social History   Social History  . Marital status: Single    Spouse name: N/A  . Number of children: N/A  . Years of education: 94   Occupational  History  . guitarist    Social History Main Topics  . Smoking status: Former Smoker    Packs/day: 1.00    Years: 10.00    Quit date: 04/23/2012  . Smokeless tobacco: Never Used  . Alcohol use No  . Drug use: No  . Sexual activity: Not on file   Other Topics Concern  . Not on file   Social History Narrative  . No narrative on file    FH:  Family History  Problem Relation Age of Onset  . Heart disease Father     heart transplant  . Hypertension Mother     Past Medical History:  Diagnosis Date  . Gout   . Hypertension   . Obese     Current Outpatient Prescriptions  Medication Sig Dispense Refill  . acetaminophen (TYLENOL) 325 MG tablet Take 650 mg by mouth every 6 (six) hours as needed for mild pain.     . carvedilol (COREG) 12.5 MG tablet Take 1.5 tablets (18.75 mg total) by mouth 2 (two) times daily. 90 tablet 3  . colchicine 0.6 MG tablet Take 1 tablet (0.6 mg total) by mouth daily as needed (gout). 30 tablet 6  . digoxin (LANOXIN) 0.25 MG tablet Take 1 tablet (0.25 mg total) by mouth daily. 30 tablet 6  . furosemide (LASIX) 40 MG tablet Take 1 tablet (40 mg total) by mouth daily. 30 tablet 6  . isosorbide-hydrALAZINE (BIDIL) 20-37.5 MG tablet Take 2 tablets by mouth 3 (  three) times daily. 180 tablet 3  . OXYGEN 3lpm 24/7  [facility]    . sacubitril-valsartan (ENTRESTO) 97-103 MG Take 1 tablet by mouth 2 (two) times daily. 60 tablet 6  . spironolactone (ALDACTONE) 25 MG tablet Take 1 tablet (25 mg total) by mouth daily. 30 tablet 6   No current facility-administered medications for this encounter.    Facility-Administered Medications Ordered in Other Encounters  Medication Dose Route Frequency Provider Last Rate Last Dose  . perflutren lipid microspheres (DEFINITY) IV suspension  1-10 mL Intravenous PRN Dolores Pattyaniel R Bensimhon, MD   2 mL at 03/23/16 1427    Vitals:   03/23/16 1459  BP: 138/76  Pulse: 92  SpO2: 100%  Weight: (!) 307 lb 8 oz (139.5 kg)    PHYSICAL  EXAM: General:  Well appearing. No resp difficulty. Ambulated in the clinic without difficulty.  Girlfriend present  HEENT: normal Neck: supple. JVP flat. Carotids 2+ bilaterally; no bruits. No lymphadenopathy or thryomegaly appreciated. Cor: PMI normal. Regular rate & rhythm. No rubs, gallops or murmurs. Lungs: clear on room air.  Abdomen: obese, soft, nontender, nondistended. No hepatosplenomegaly. No bruits or masses. Good bowel sounds. Extremities: no cyanosis, clubbing, rash, edema. R wrist tender to touch or movement  Neuro: alert & orientedx3, cranial nerves grossly intact. Moves all 4 extremities w/o difficulty. Affect pleasant.  ASSESSMENT & PLAN: 1. Chronic Systolic Heart Failure- NICM - cors ok on 12/09/2015. ECHO 12/09/2015 EF 10-15%. Todays ECHO is improving 40-45%.  NYHA II. Doing great. Volume status stable. Continue  lasix 40 mg daily. Continue 25  mg spiro daily.  Increase coreg 25 mg twice a day.   Stop digoxin.   Continue entresto 97-103 twice a day.  Continue bidil 2 tab three times daily.  Today we reinforced daily weights, low salt diet, and limiting fluid intake to < 2 liters per day. 2. HTN- Improved.  3. OSA-Completed sleep study and needs CPAP. Referred to Dr Mayford Knifeurner for CPAP titration. Has follow up January 11th.  4. Respiratory Failure- hypoxia--> discharged with oxygen 2 liters on exertion. Continue to maintain oxygen saturations.  5. Morbid Obesity - needs to lose weight. Discussed portion control. 6. Acute Gout in R wrist - treat with prednisone for 2 days then start allopurinol 200mg  daily. Continue colchicine as needed for mild flares.  7. Hyperkalemia- Most recent K 4.4   BMET today.    Follow up in  3 months.   Amy Clegg NP-C  3:05 PM   Patient seen and examined with Tonye BecketAmy Clegg, NP. We discussed all aspects of the encounter. I agree with the assessment and plan as stated above.   Echo reviewed personally. EF much improved. Feeling much better. NYHA  I-early II. Volume status looks good. Stop digoxin. Increase carvedilol .Treat gout as above.   Bensimhon, Daniel,MD 7:04 PM

## 2016-03-23 NOTE — Patient Instructions (Addendum)
STOP Digoxin.  INCREASE Carvedilol (Coreg) to 25 mg twice daily.  START Allopurinol 200 mg once daily.  START Prednisone 40 mg (2 tabs) once daily for TWO DAYS.  Routine lab work today. Will notify you of abnormal results, otherwise no news is good news!  Follow up with Amy Clegg NP-C in 3 months. We will call you closer to this time, or you may call our office to schedule 1 month before you are due to be seen.  Do the following things EVERYDAY: 1) Weigh yourself in the morning before breakfast. Write it down and keep it in a log. 2) Take your medicines as prescribed 3) Eat low salt foods-Limit salt (sodium) to 2000 mg per day.  4) Stay as active as you can everyday 5) Limit all fluids for the day to less than 2 liters

## 2016-03-23 NOTE — Progress Notes (Signed)
  Echocardiogram 2D Echocardiogram with Definity has been performed.  Nolon Rod 03/23/2016, 2:49 PM

## 2016-04-03 MED FILL — INDOMETHACIN 50 MG CAPSULE: 50 | 10 days supply | Qty: 30 | Fill #0

## 2016-04-05 MED FILL — SPIRONOLACTONE 25 MG TABLET: 25 | 30 days supply | Qty: 15 | Fill #0

## 2016-04-05 MED FILL — BIDIL TABLET: 20-37.5 | 30 days supply | Qty: 180 | Fill #1

## 2016-04-05 MED FILL — ENTRESTO 97 MG-103 MG TAB: 97-103 | 30 days supply | Qty: 60 | Fill #0

## 2016-04-05 MED FILL — FUROSEMIDE 40 MG TABLET: 40 | 30 days supply | Qty: 30 | Fill #0

## 2016-04-27 MED FILL — INDOMETHACIN 50 MG CAPSULE: 50 | 10 days supply | Qty: 30 | Fill #0

## 2016-04-30 ENCOUNTER — Telehealth (HOSPITAL_COMMUNITY): Payer: Self-pay | Admitting: *Deleted

## 2016-04-30 ENCOUNTER — Encounter (HOSPITAL_COMMUNITY): Payer: Self-pay | Admitting: *Deleted

## 2016-04-30 MED FILL — ENTRESTO 97 MG-103 MG TAB: 97-103 | 30 days supply | Qty: 60 | Fill #1

## 2016-04-30 MED FILL — SPIRONOLACTONE 25 MG TABLET: 25 | 30 days supply | Qty: 15 | Fill #1

## 2016-04-30 MED FILL — FUROSEMIDE 40 MG TABLET: 40 | 30 days supply | Qty: 30 | Fill #1

## 2016-04-30 MED FILL — CARVEDILOL 25 MG TABLET: 25 | 30 days supply | Qty: 60 | Fill #1

## 2016-04-30 NOTE — Telephone Encounter (Signed)
Pt called requesting a letter be faxed to [facility] of Court stating why he can't serve on jury duty.  Pt's last EF was 30-35%, advised will send letter to excuse him at this time.  Advised pt he should be able to serve later in the future as EF is improving.  Letter faxed to [phone]

## 2016-05-01 ENCOUNTER — Telehealth (HOSPITAL_COMMUNITY): Payer: Self-pay | Admitting: *Deleted

## 2016-05-01 MED ORDER — HYDRALAZINE HCL 50 MG PO TABS
75.0000 mg | ORAL_TABLET | Freq: Three times a day (TID) | ORAL | 3 refills | Status: DC
Start: 2016-05-01 — End: 2016-05-24

## 2016-05-01 MED ORDER — ISOSORBIDE MONONITRATE ER 60 MG PO TB24: 120.0000 mg | ORAL_TABLET | Freq: Every day | ORAL | 2 refills | Status: DC

## 2016-05-01 MED FILL — hydrALAZINE HCL 50 MG TABS: 50 | 30 days supply | Qty: 135 | Fill #0

## 2016-05-01 MED FILL — ISOSORBIDE MN ER 60 MG TAB: 60 | 30 days supply | Qty: 60 | Fill #0

## 2016-05-01 NOTE — Telephone Encounter (Signed)
[facility] outpatient pharmacy called and said Bidil is on backorder and co-payment for patient was high.   Cicero Duck, PharmD consulted and advises to discontinue Bidil and order Imdur 120 mg (2 Tabs) Once Daily and Hydralazine 75 mg (1.5 Tab) Three times daily.  Orders placed in Epic and sent to pharmacy.

## 2016-05-02 ENCOUNTER — Encounter: Payer: Self-pay | Admitting: *Deleted

## 2016-05-02 ENCOUNTER — Other Ambulatory Visit: Payer: Self-pay | Admitting: *Deleted

## 2016-05-02 ENCOUNTER — Telehealth: Payer: Self-pay | Admitting: *Deleted

## 2016-05-02 DIAGNOSIS — G4733 Obstructive sleep apnea (adult) (pediatric): Secondary | ICD-10-CM

## 2016-05-02 NOTE — Telephone Encounter (Signed)
Patient appointment was cancelled for 1/11/18b/c he wasn't set up on cpap and he needed a cpap titration. His records will be in cpap assistant ofice until he returns for his appointment with dr Mayford Knife    By Reesa Chew, CMA

## 2016-05-03 ENCOUNTER — Ambulatory Visit: Payer: BC Managed Care – PPO | Admitting: Cardiology

## 2016-05-11 ENCOUNTER — Other Ambulatory Visit (HOSPITAL_COMMUNITY): Payer: Self-pay | Admitting: *Deleted

## 2016-05-11 ENCOUNTER — Telehealth (HOSPITAL_COMMUNITY): Payer: Self-pay

## 2016-05-11 MED ORDER — SPIRONOLACTONE 25 MG PO TABS: 25.0000 mg | ORAL_TABLET | Freq: Every day | ORAL | 6 refills | Status: DC

## 2016-05-11 MED FILL — INDOMETHACIN 50 MG CAPSULE: 50 | 10 days supply | Qty: 30 | Fill #1

## 2016-05-11 MED FILL — SPIRONOLACTONE 25 MG TABLET: 25 | 30 days supply | Qty: 30 | Fill #0

## 2016-05-11 NOTE — Telephone Encounter (Addendum)
Received forms to update patient's disability/FMLA status. Have been writing patient out of work past few months as he was a new CHF diagnosis. 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 Left VM on patient's machine to call back and clarify what he prefers to do at this time. Will file paperwork back in disability folder at this time until patient clarifies needs.  Ave Filter, RN

## 2016-05-16 ENCOUNTER — Telehealth (HOSPITAL_COMMUNITY): Payer: Self-pay | Admitting: Cardiology

## 2016-05-16 NOTE — Telephone Encounter (Signed)
Patient called to request a return call regarding disability forms. Patient reports if paperwork is not completed and returned by Friday 1/26 he will be terminated. He does have a question for the person assisting with paperwork.   Advised message will be returned to [facility] and she will return call ASAP

## 2016-05-18 ENCOUNTER — Encounter (HOSPITAL_COMMUNITY): Payer: Self-pay

## 2016-05-18 NOTE — Progress Notes (Signed)
FMLA continued to keep patient out of work until follow up with Dr. Gala Romney. Forms faxed to provided # [phone] attn: Lula Olszewski, RN

## 2016-05-24 ENCOUNTER — Ambulatory Visit (HOSPITAL_BASED_OUTPATIENT_CLINIC_OR_DEPARTMENT_OTHER): Payer: BC Managed Care – PPO | Attending: Cardiology | Admitting: Cardiology

## 2016-05-24 ENCOUNTER — Other Ambulatory Visit (HOSPITAL_COMMUNITY): Payer: Self-pay | Admitting: Pharmacist

## 2016-05-24 ENCOUNTER — Telehealth (HOSPITAL_COMMUNITY): Payer: Self-pay | Admitting: Pharmacist

## 2016-05-24 VITALS — Ht 73.0 in | Wt 307.0 lb

## 2016-05-24 DIAGNOSIS — G4733 Obstructive sleep apnea (adult) (pediatric): Secondary | ICD-10-CM | POA: Insufficient documentation

## 2016-05-24 DIAGNOSIS — G4730 Sleep apnea, unspecified: Secondary | ICD-10-CM | POA: Diagnosis present

## 2016-05-24 DIAGNOSIS — Z79899 Other long term (current) drug therapy: Secondary | ICD-10-CM | POA: Diagnosis not present

## 2016-05-24 DIAGNOSIS — R0902 Hypoxemia: Secondary | ICD-10-CM | POA: Diagnosis not present

## 2016-05-24 DIAGNOSIS — I493 Ventricular premature depolarization: Secondary | ICD-10-CM | POA: Diagnosis not present

## 2016-05-24 MED ORDER — CARVEDILOL 25 MG PO TABS: 25.0000 mg | ORAL_TABLET | Freq: Two times a day (BID) | ORAL | 5 refills | Status: DC

## 2016-05-24 MED ORDER — FUROSEMIDE 40 MG PO TABS: 40.0000 mg | ORAL_TABLET | Freq: Every day | ORAL | 5 refills | Status: DC

## 2016-05-24 MED ORDER — HYDRALAZINE HCL 50 MG PO TABS: 75.0000 mg | ORAL_TABLET | Freq: Three times a day (TID) | ORAL | 5 refills | Status: DC

## 2016-05-24 MED ORDER — SPIRONOLACTONE 25 MG PO TABS: 25.0000 mg | ORAL_TABLET | Freq: Every day | ORAL | 5 refills | Status: DC

## 2016-05-24 MED ORDER — ISOSORBIDE MONONITRATE ER 60 MG PO TB24: 120.0000 mg | ORAL_TABLET | Freq: Every day | ORAL | 5 refills | Status: DC

## 2016-05-24 MED ORDER — SACUBITRIL-VALSARTAN 97-103 MG PO TABS: 1.0000 | ORAL_TABLET | Freq: Two times a day (BID) | ORAL | 3 refills | Status: DC

## 2016-05-24 MED ORDER — ALLOPURINOL 100 MG PO TABS: 200.0000 mg | ORAL_TABLET | Freq: Every day | ORAL | 5 refills | Status: DC

## 2016-05-24 MED FILL — CARVEDILOL 25 MG TABLET: 25 | 30 days supply | Qty: 60 | Fill #2

## 2016-05-24 MED FILL — FUROSEMIDE 40 MG TABLET: 40 | 30 days supply | Qty: 30 | Fill #2

## 2016-05-24 MED FILL — hydrALAZINE HCL 50 MG TABS: 50 | 30 days supply | Qty: 135 | Fill #1

## 2016-05-24 MED FILL — ALLOPURINOL 100 MG TABLET: 100 | 30 days supply | Qty: 60 | Fill #1

## 2016-05-24 MED FILL — ISOSORBIDE MN ER 60 MG TAB: 60 | 30 days supply | Qty: 60 | Fill #1

## 2016-05-24 NOTE — Telephone Encounter (Signed)
Spoke with Neysa Bonito, Zabdiel's girlfriend, who states that as of 05/24/16 he no longer has Rx insurance. Until he is able to get new insurance, I will send his HF medications to our Outpatient Pharmacy so that he can continue taking them at no cost to him. I have also asked that they come by the clinic to fill out a [employer] patient assistance application so that he can continue to receive Entresto at no cost to him.   [name]. Bonnye Fava, PharmD, BCPS, CPP Clinical Pharmacist Pager: [phone] Phone: [phone] 05/24/2016 11:21 AM

## 2016-05-26 NOTE — Procedures (Signed)
   Patient Name: Antonio Morgan, Antonio Morgan Date: 05/24/2016 Gender: Male D.O.B: Apr 26, 1980 Age (years): 51 Referring Provider: Tonye Becket NP Height (inches): 74 Interpreting Physician: Armanda Magic MD, ABSM Weight (lbs): 307 RPSGT: Armen Pickup BMI: 40 MRN: [id number] Neck Size: 19.50  CLINICAL INFORMATION The patient is referred for a CPAP titration to treat sleep apnea. Date of NPSG, Split Night or HST:01/12/2016  SLEEP STUDY TECHNIQUE As per the AASM Manual for the Scoring of Sleep and Associated Events v2.3 (April 2016) with a hypopnea requiring 4% desaturations.  The channels recorded and monitored were frontal, central and occipital EEG, electrooculogram (EOG), submentalis EMG (chin), nasal and oral airflow, thoracic and abdominal wall motion, anterior tibialis EMG, snore microphone, electrocardiogram, and pulse oximetry. Continuous positive airway pressure (CPAP) was initiated at the beginning of the study and titrated to treat sleep-disordered breathing.  MEDICATIONS Medications self-administered by patient taken the night of the study : CARVEDILOL, HYDRALAZINE  TECHNICIAN COMMENTS Comments added by technician: ONE RESTROOM VISTED  Comments added by scorer: N/A  RESPIRATORY PARAMETERS Optimal PAP Pressure (cm): N/A  AHI at Optimal Pressure (/hr):N/A Overall Minimal O2 (%):78.00 S upine % at Optimal Pressure (%): N/A Minimal O2 at Optimal Pressure (%): N/A      SLEEP ARCHITECTURE The study was initiated at 10:25:10 PM and ended at 4:16:56 AM.  Sleep onset time was 52.2 minutes and the sleep efficiency was 79.2%. The total sleep time was 278.5 minutes.  The patient spent 3.95% of the night in stage N1 sleep, 67.50% in stage N2 sleep, 0.00% in stage N3 and 28.55% in REM.Stage REM latency was 74.5 minutes  Wake after sleep onset was 21.1. Alpha intrusion was absent. Supine sleep was 85.23%.  CARDIAC DATA The 2 lead EKG demonstrated sinus rhythm. The mean heart rate  was 77.81 beats per minute. Other EKG findings include: PVCs.  LEG MOVEMENT DATA The total Periodic Limb Movements of Sleep (PLMS) were 0. The PLMS index was 0.00. A PLMS index of <15 is considered normal in adults.  IMPRESSIONS - The optimal PAP pressure could not be obtained due to ongoing respiratory events and hypoxemia. - Central sleep apnea was not noted during this titration (CAI = 1.5/h). - Severe oxygen desaturations were observed during this titration (min O2 = 78.00%). - No snoring was audible during this study. - 2-lead EKG demonstrated: PVCs - Clinically significant periodic limb movements were not noted during this study. Arousals associated with PLMs were rare.  DIAGNOSIS - Obstructive Sleep Apnea (327.23 [G47.33 ICD-10])  RECOMMENDATIONS - As the patient could ont be adequately titrated on CPAP due to ongoing respiratory events, recommend repeat in lab BiPAP titration. - Avoid alcohol, sedatives and other CNS depressants that may worsen sleep apnea and disrupt normal sleep architecture. - Sleep hygiene should be reviewed to assess factors that may improve sleep quality. - Weight management and regular exercise should be initiated or continued.  Armanda Magic Diplomate, American Board of Sleep Medicine  ELECTRONICALLY SIGNED ON:  05/26/2016, 6:06 PM [facility] PH: [phone]   FX: [phone] ACCREDITED BY THE AMERICAN ACADEMY OF SLEEP MEDICINE

## 2016-05-28 ENCOUNTER — Telehealth (HOSPITAL_COMMUNITY): Payer: Self-pay | Admitting: Pharmacist

## 2016-05-28 NOTE — Telephone Encounter (Signed)
[employer] patient assistance is providing a 3 month temporary approval for Entresto 97-103 mg BID. They are requiring that the patient apply for [facility] Medicaid and if denied, he can continue to receive Entresto at no cost to him.   Antonio Morgan. Bonnye Fava, PharmD, BCPS, CPP Clinical Pharmacist Pager: [phone] Phone: [phone] 05/28/2016 12:32 PM

## 2016-05-29 ENCOUNTER — Telehealth: Payer: Self-pay | Admitting: *Deleted

## 2016-05-29 DIAGNOSIS — G4733 Obstructive sleep apnea (adult) (pediatric): Secondary | ICD-10-CM

## 2016-05-29 NOTE — Telephone Encounter (Signed)
Called the patient and gave him the study results and the recommendations, he verbalized understanding but he says at night his nose gets stopped up with sinus drainage and it feels like he cant breathe and the mask made him feel claustrophobic. He feels like his breathing was restricted because of the strong air pressure in the mask. He thinks a different mask would make a difference.

## 2016-05-29 NOTE — Telephone Encounter (Signed)
-----   Message from Quintella Reichert, MD sent at 05/26/2016  6:09 PM EST ----- Pt had unsuccessful CPAP titration due to ongoing respiratory events. Please setup BiPAP titration in lab.

## 2016-05-30 ENCOUNTER — Encounter: Payer: Self-pay | Admitting: *Deleted

## 2016-05-30 NOTE — Telephone Encounter (Signed)
Have him tell tech at sleep lab when he goes for BiPAP titration

## 2016-05-30 NOTE — Addendum Note (Signed)
Addended by: Reesa Chew on: 05/30/2016 12:09 PM   Modules accepted: Orders

## 2016-05-30 NOTE — Telephone Encounter (Signed)
The patient will speak to the tech prior to his sleep study about his concerns

## 2016-06-04 ENCOUNTER — Telehealth (HOSPITAL_COMMUNITY): Payer: Self-pay | Admitting: Pharmacist

## 2016-06-04 NOTE — Telephone Encounter (Signed)
Mr. Askin called stating that he has not received Entresto from [employer] patient assistance foundation yet. I have advised him to call them at [phone]. Verbalized understanding.   Antonio Morgan. Bonnye Fava, PharmD, BCPS, CPP Clinical Pharmacist Pager: [phone] Phone: [phone] 06/04/2016 4:04 PM

## 2016-06-08 ENCOUNTER — Telehealth (HOSPITAL_COMMUNITY): Payer: Self-pay | Admitting: Pharmacist

## 2016-06-08 NOTE — Telephone Encounter (Signed)
Mr. Antonio Morgan states that he is still having a gouty flare although he took a 2 day course of prednisone last week. Dr. Gala Romney would like to refer him to Dr. Dierdre Forth in rheumatology to assist in the management of his gout moving forward. He did state that he refilled colchicine which he has not been taking recently and I recommended that he take 2 tablets now and then continue with 1 tablet daily until he can get an appointment with Dr. Dierdre Forth.  [name]. Bonnye Fava, PharmD, BCPS, CPP Clinical Pharmacist Pager: [phone] Phone: [phone] 06/08/2016 3:59 PM

## 2016-06-14 MED FILL — SPIRONOLACTONE 25 MG TABLET: 25 | 30 days supply | Qty: 30 | Fill #1 | Status: TO

## 2016-07-03 ENCOUNTER — Encounter (HOSPITAL_COMMUNITY): Payer: BC Managed Care – PPO

## 2016-07-04 MED FILL — CARVEDILOL 25 MG TABLET: 25 | 30 days supply | Qty: 60 | Fill #3

## 2016-07-04 MED FILL — ENTRESTO 97 MG-103 MG TAB: 97-103 | 30 days supply | Qty: 60 | Fill #2

## 2016-07-04 MED FILL — FUROSEMIDE 40 MG TABLET: 40 | 30 days supply | Qty: 30 | Fill #3

## 2016-07-04 MED FILL — ISOSORBIDE MN ER 60 MG TAB: 60 | 30 days supply | Qty: 60 | Fill #2

## 2016-07-04 MED FILL — hydrALAZINE HCL 50 MG TABS: 50 | 30 days supply | Qty: 135 | Fill #2

## 2016-07-05 ENCOUNTER — Ambulatory Visit (HOSPITAL_COMMUNITY)
Admission: RE | Admit: 2016-07-05 | Discharge: 2016-07-05 | Disposition: A | Discharge status: Home / Self Care | Payer: BC Managed Care – PPO | Source: Ambulatory Visit | Attending: Cardiology | Admitting: Cardiology

## 2016-07-05 VITALS — BP 126/80 | HR 99 | Wt 339.6 lb

## 2016-07-05 DIAGNOSIS — Z6841 Body Mass Index (BMI) 40.0 and over, adult: Secondary | ICD-10-CM | POA: Insufficient documentation

## 2016-07-05 DIAGNOSIS — E875 Hyperkalemia: Secondary | ICD-10-CM | POA: Insufficient documentation

## 2016-07-05 DIAGNOSIS — Z9981 Dependence on supplemental oxygen: Secondary | ICD-10-CM | POA: Diagnosis not present

## 2016-07-05 DIAGNOSIS — Z87891 Personal history of nicotine dependence: Secondary | ICD-10-CM | POA: Insufficient documentation

## 2016-07-05 DIAGNOSIS — I429 Cardiomyopathy, unspecified: Secondary | ICD-10-CM | POA: Diagnosis not present

## 2016-07-05 DIAGNOSIS — Z8249 Family history of ischemic heart disease and other diseases of the circulatory system: Secondary | ICD-10-CM | POA: Diagnosis not present

## 2016-07-05 DIAGNOSIS — I5022 Chronic systolic (congestive) heart failure: Secondary | ICD-10-CM | POA: Diagnosis not present

## 2016-07-05 DIAGNOSIS — I110 Hypertensive heart disease with heart failure: Secondary | ICD-10-CM | POA: Insufficient documentation

## 2016-07-05 DIAGNOSIS — M109 Gout, unspecified: Secondary | ICD-10-CM | POA: Diagnosis not present

## 2016-07-05 DIAGNOSIS — G4733 Obstructive sleep apnea (adult) (pediatric): Secondary | ICD-10-CM

## 2016-07-05 DIAGNOSIS — I10 Essential (primary) hypertension: Secondary | ICD-10-CM

## 2016-07-05 LAB — BASIC METABOLIC PANEL
Anion gap: 8 (ref 5–15)
BUN: 12 mg/dL (ref 6–20)
CALCIUM: 9.5 mg/dL (ref 8.9–10.3)
CHLORIDE: 101 mmol/L (ref 101–111)
CO2: 26 mmol/L (ref 22–32)
CREATININE: 1.26 mg/dL — AB (ref 0.61–1.24)
GFR calc Af Amer: >60 mL/min (ref >60)
GFR calc non Af Amer: >60 mL/min (ref >60)
GLUCOSE: 136 mg/dL — AB (ref 65–99)
Potassium: 4.4 mmol/L (ref 3.5–5.1)
Sodium: 135 mmol/L (ref 135–145)

## 2016-07-05 NOTE — Patient Instructions (Signed)
Labs today We will only contact you if something comes back abnormal or we need to make some changes. Otherwise no news is good news!   Your physician recommends that you schedule a follow-up appointment in: 6 months with Amy Clegg,NP or Dr Shirlee Latch  Do the following things EVERYDAY: 1) Weigh yourself in the morning before breakfast. Write it down and keep it in a log. 2) Take your medicines as prescribed 3) Eat low salt foods-Limit salt (sodium) to 2000 mg per day.  4) Stay as active as you can everyday 5) Limit all fluids for the day to less than 2 liters

## 2016-07-05 NOTE — Progress Notes (Signed)
Advanced Heart Failure Medication Review by a Pharmacist  Does the patient  feel that his/her medications are working for him/her?  yes  Has the patient been experiencing any side effects to the medications prescribed?  No - concerned that meds may be causing gout flare  Does the patient measure his/her own blood pressure or blood glucose at home?  yes   Does the patient have any problems obtaining medications due to transportation or finances?   no  Understanding of regimen: good Understanding of indications: good Potential of compliance: good Patient understands to avoid NSAIDs. Patient understands to avoid decongestants.  Issues to address at subsequent visits: None  Pharmacist comments: Mr. Antonio Morgan is a pleasant 36 yo AAM presenting to advanced HF clinic with his significant other. He endorses good compliance with his medications. He is concerned that his medications are causing a gout flare - he has been taking colchicine 0.6mg  daily for ~1 month with very slow improvement in symptoms. Suzzette Righter was assessing the patient while I reviewed his medications, and was addressing his concerns with gout. He had no other questions or concerns.   Allie Bossier, PharmD PGY1 Pharmacy Resident [phone] (Pager) 07/05/2016 2:46 PM  Time with patient: 10 min Preparation and documentation time: 2 min Total time: 12 min

## 2016-07-05 NOTE — Progress Notes (Signed)
PCP: Dr Juanetta Gosling  Primary Cardiologist:Dr Bensimhon   HPI: Antonio Word Cornigansis a 36 year old male with a past medical history of HTN and obesity diagnosed with acute systolic heart failure august 2956.   He presented to [facility] ED on 12/08/2015 for worsening dyspnea over the last year.  On admit , he was tachycardiac into the low-100's. Oxygen saturations were initially 85%. CXR on admit showed vascular congestion. CTA was negative for PE. ECHO was performed and showed severely reduced LVEF 10-15% and RV moderately/severely dilated. Diuresed with IV lasix and transitioned to lasix 40 mg twice daily. RHC/LHC 12/13/2015 with normal cors and well compensated hemodynamics. Also discharged on 2 liters oxygen continuously. Discharge weight was 352 pounds.   Today he returns for HF follow up. Complaining of gout pain in wrist and great toe. Denies SOB/PND/Orthopnea.  He has not been able to tolerate CPAP. He has follow up for CPAP. Weight at home up to 329 pounds. Drinking 2 liters per day. Taking all medications.  Currently not working. Lives with girl friend.    ECHO 12/09/2015 10-15% RV mod-severely dilated.  ECHO 03/23/2016 EF 35-40%   RHC/LHC 12/13/2015 RA = 6 RV = 44/8/10 PA = 43/17 (31) PCW = 13 Fick cardiac output/index = 7.6/2.8 PVR = 2.4 WU SVR = 855  Ao sat = 94%  PA sat = 67%, 70% Assessment: 1. Essentially normal coronaries 2. Well compensated hemodynamics with mild PAH  Labs  12/21/2015: K 5.3 Creatinine 1.33 01/17/2016: K 4.4 Creatinine 1.3  03/23/2016: K 3.8 Creatinine 0.93    ROS: All systems negative except as listed in HPI, PMH and Problem List.  SH:  Social History   Social History  . Marital status: Single    Spouse name: N/A  . Number of children: N/A  . Years of education: 70   Occupational History  . guitarist    Social History Main Topics  . Smoking status: Former Smoker    Packs/day: 1.00    Years: 10.00    Quit date: 04/23/2012  . Smokeless tobacco: Never  Used  . Alcohol use No  . Drug use: No  . Sexual activity: Not on file   Other Topics Concern  . Not on file   Social History Narrative  . No narrative on file    FH:  Family History  Problem Relation Age of Onset  . Heart disease Father     heart transplant  . Hypertension Mother     Past Medical History:  Diagnosis Date  . Gout   . Hypertension   . Obese     Current Outpatient Prescriptions  Medication Sig Dispense Refill  . acetaminophen (TYLENOL) 325 MG tablet Take 650 mg by mouth every 6 (six) hours as needed for mild pain.     . carvedilol (COREG) 25 MG tablet Take 1 tablet (25 mg total) by mouth 2 (two) times daily. 60 tablet 5  . colchicine 0.6 MG tablet Take 1 tablet (0.6 mg total) by mouth daily as needed (gout). 30 tablet 6  . furosemide (LASIX) 40 MG tablet Take 1 tablet (40 mg total) by mouth daily. 30 tablet 5  . hydrALAZINE (APRESOLINE) 50 MG tablet Take 1.5 tablets (75 mg total) by mouth 3 (three) times daily. 135 tablet 5  . isosorbide mononitrate (IMDUR) 60 MG 24 hr tablet Take 2 tablets (120 mg total) by mouth daily. 60 tablet 5  . OXYGEN 3lpm 24/7  [facility]    . sacubitril-valsartan (ENTRESTO) 97-103  MG Take 1 tablet by mouth 2 (two) times daily. 180 tablet 3  . spironolactone (ALDACTONE) 25 MG tablet Take 1 tablet (25 mg total) by mouth daily. 30 tablet 5  . allopurinol (ZYLOPRIM) 100 MG tablet Take 2 tablets (200 mg total) by mouth daily. (Patient not taking: Reported on 07/05/2016) 60 tablet 5   No current facility-administered medications for this encounter.     Vitals:   07/05/16 1431  BP: 126/80  Pulse: 99  Weight: (!) 339 lb 9.6 oz (154 kg)    PHYSICAL EXAM: General:  NAD. Walked in the clinic with his girlfriend.  HEENT: normal Neck: supple. JVP flat. Carotids 2+ bilaterally; no bruits. No lymphadenopathy or thryomegaly appreciated. Cor: PMI normal. Regular rate & rhythm. No rubs, gallops or murmurs. Lungs: clear on room air.  Abdomen:  obese, soft, nontender, nondistended. No hepatosplenomegaly. No bruits or masses. Good bowel sounds. Extremities: no cyanosis, clubbing, rash, edema. R wrist tender to touch or movement  Neuro: alert & orientedx3, cranial nerves grossly intact. Moves all 4 extremities w/o difficulty. Affect pleasant.  ASSESSMENT & PLAN: 1. Chronic Systolic Heart Failure- NICM - cors ok on 12/09/2015. ECHO 12/09/2015 EF 10-15%. ECHO improved in  35-40%   NYHA II. Doing great. Volume status stable.  Continue lasix 40 mg daily and 25 mg spiro daily.  On goal dose of coreg, bidil, entresto.   Today we reinforced daily weights, low salt diet, and limiting fluid intake to < 2 liters per day. 2. HTN- Stable.  3. OSA-Completed sleep study and needs CPAP. He has follow up with  Dr Mayford Knife for CPAP titration.  4. Respiratory Failure- hypoxia--> resolved.   5. Morbid Obesity - needs to lose weight. Discussed portion control. 6. Gout: Continue colchicine + allopurinol 7. Hyperkalemia- BMET today.     Follow up in  6 months.   Huston Stonehocker NP-C  2:51 PM

## 2016-07-19 ENCOUNTER — Encounter (HOSPITAL_BASED_OUTPATIENT_CLINIC_OR_DEPARTMENT_OTHER): Payer: BC Managed Care – PPO

## 2016-08-07 ENCOUNTER — Other Ambulatory Visit (HOSPITAL_COMMUNITY): Payer: Self-pay | Admitting: *Deleted

## 2016-08-07 MED ORDER — SACUBITRIL-VALSARTAN 97-103 MG PO TABS: 1.0000 | ORAL_TABLET | Freq: Two times a day (BID) | ORAL | 3 refills | Status: DC

## 2016-08-16 ENCOUNTER — Telehealth (HOSPITAL_COMMUNITY): Payer: Self-pay | Admitting: Pharmacist

## 2016-08-16 NOTE — Telephone Encounter (Signed)
[name] called stating that he is still having gouty symptoms in his knee, ankle, wrist, etc. Last time he went to urgent care, he was given an Rx for indomethacin which he has been using over the past couple of days with minimal improvement in his symptoms. He is not taking allopurinol or colchicine. I have asked him to try the colchicine (2 tabs then 1 tab 1 hr later, followed by 1 tablet daily) to see if this helps until his appointment with rheumatology on 5/12. Will forward to Dr. Suella Grove to see if he has any other recommendations. His weight is stable on his furosemide 40 mg daily but he is also not able to exercise with his gout.   Antonio Morgan. Bonnye Fava, PharmD, BCPS, CPP Clinical Pharmacist Pager: [phone] Phone: [phone] 08/16/2016 4:19 PM

## 2016-09-03 ENCOUNTER — Encounter (HOSPITAL_BASED_OUTPATIENT_CLINIC_OR_DEPARTMENT_OTHER): Payer: BC Managed Care – PPO

## 2016-09-04 ENCOUNTER — Telehealth (HOSPITAL_COMMUNITY): Payer: Self-pay | Admitting: Pharmacist

## 2016-09-04 NOTE — Telephone Encounter (Signed)
Mr. Debenedictis called asking if it would be ok for him to take ibuprofen for his gout. I have advised him that we usually don't recommend regular use of ibuprofen for our patients 2/2 its bleeding, renal and CV effects. If he is going to use ibuprofen, I recommended that he use the lowest effective dose for the shortest amount of time. He asked if acetaminophen would be a better option for him to take and I did advise him that this should be safer for him to take. He has an appointment with the rheumatologist for his continued gout issues on 5/25.   [name]. Bonnye Fava, PharmD, BCPS, CPP Clinical Pharmacist Pager: [phone] Phone: [phone] 09/04/2016 2:23 PM

## 2016-10-02 ENCOUNTER — Telehealth (HOSPITAL_COMMUNITY): Payer: Self-pay | Admitting: Pharmacist

## 2016-10-02 NOTE — Telephone Encounter (Signed)
Disability forms faxed today for Mr. Chatwin to return to work on 12/06/16. Patient aware.   [name]. Bonnye Fava, PharmD, BCPS, CPP Clinical Pharmacist Pager: [phone] Phone: [phone] 10/02/2016 3:39 PM

## 2016-10-08 ENCOUNTER — Telehealth (HOSPITAL_COMMUNITY): Payer: Self-pay | Admitting: Pharmacist

## 2016-10-08 NOTE — Telephone Encounter (Signed)
Mr. Antonio Morgan called asking if he could start taking his Imdur at 8-9 am instead of his usually 12 pm. I have advised him that this should be ok but then he needs to try to take it at around 8-9 am every day. He verbalized understanding and was grateful for the advice.   [name]. Bonnye Fava, PharmD, BCPS, CPP Clinical Pharmacist Pager: [phone] Phone: [phone] 10/08/2016 2:46 PM

## 2016-11-05 ENCOUNTER — Telehealth (HOSPITAL_COMMUNITY): Payer: Self-pay | Admitting: Pharmacist

## 2016-11-05 ENCOUNTER — Ambulatory Visit (HOSPITAL_BASED_OUTPATIENT_CLINIC_OR_DEPARTMENT_OTHER): Payer: BC Managed Care – PPO | Attending: Cardiology | Admitting: Cardiology

## 2016-11-05 VITALS — Ht 74.0 in | Wt 330.0 lb

## 2016-11-05 DIAGNOSIS — G4733 Obstructive sleep apnea (adult) (pediatric): Secondary | ICD-10-CM

## 2016-11-05 DIAGNOSIS — G4736 Sleep related hypoventilation in conditions classified elsewhere: Secondary | ICD-10-CM | POA: Insufficient documentation

## 2016-11-05 DIAGNOSIS — Z79899 Other long term (current) drug therapy: Secondary | ICD-10-CM | POA: Diagnosis not present

## 2016-11-05 NOTE — Telephone Encounter (Signed)
Mr. Larsh called asking if his Antonio Morgan was one of the valsartan products that was recently recalled. I reassured him that to the best of our knowledge, Antonio Morgan was not one of the products recalled.   [name]. Bonnye Fava, PharmD, BCPS, CPP Clinical Pharmacist Pager: [phone] Phone: [phone] 11/05/2016 4:42 PM

## 2016-11-08 NOTE — Procedures (Signed)
   Patient Name: Antonio Morgan, [name] Date: 11/05/2016 Gender: Male D.O.B: [birth year] Age (years): 78 Referring Provider: Tonye Becket NP Height (inches): 74 Interpreting Physician: Armanda Magic MD, ABSM Weight (lbs): 307 RPSGT: Armen Pickup BMI: 40 MRN: [id number] Neck Size: 19.50  CLINICAL INFORMATION The patient is referred for a BiPAP titration to treat sleep apnea. Date of NPSG, Split Night or HST:12/23/2015  SLEEP STUDY TECHNIQUE As per the AASM Manual for the Scoring of Sleep and Associated Events v2.3 (April 2016) with a hypopnea requiring 4% desaturations.  The channels recorded and monitored were frontal, central and occipital EEG, electrooculogram (EOG), submentalis EMG (chin), nasal and oral airflow, thoracic and abdominal wall motion, anterior tibialis EMG, snore microphone, electrocardiogram, and pulse oximetry. Bilevel positive airway pressure (BPAP) was initiated at the beginning of the study and titrated to treat sleep-disordered breathing.  MEDICATIONS Medications self-administered by patient taken the night of the study : CARVEDILOL, HYDRALAZINE  RESPIRATORY PARAMETERS Optimal IPAP Pressure (cm): 22  AHI at Optimal Pressure (/hr) 0.0 Optimal EPAP Pressure (cm):18   Overall Minimal O2 (%):57.00  Minimal O2 at Optimal Pressure (%): 91.0  SLEEP ARCHITECTURE Start Time:9:46:02 PM  Stop Time:4:37:30 AM  Total Time (min):411.5  Total Sleep Time (min):277.5 Sleep Latency (min):117.3  Sleep Efficiency (%):67.4  REM Latency (min):4.0  WASO (min): 16.6 Stage N1 (%): 2.34  Stage N2 (%): 57.84  Stage N3 (%): 0.00  Stage R (%):39.82 Supine (%):57.66  Arousal Index (/hr):9.5      CARDIAC DATA The 2 lead EKG demonstrated sinus rhythm. The mean heart rate was 71.38 beats per minute. Other EKG findings include: PVCs.  LEG MOVEMENT DATA The total Periodic Limb Movements of Sleep (PLMS) were 0. The PLMS index was 0.00. A PLMS index of <15 is considered normal  in adults.  IMPRESSIONS - An optimal PAP pressure was selected for this patient ( 22 / 18 cm of water) - Central sleep apnea was not noted during this titration (CAI = 1.5/h). - Severe oxygen desaturations were observed during this titration (min O2 = 57.00%). - No snoring was audible during this study. - 2-lead EKG demonstrated: PVCs - Clinically significant periodic limb movements were not noted during this study. Arousals associated with PLMs were rare.  DIAGNOSIS - Obstructive Sleep Apnea (327.23 [G47.33 ICD-10]) - Nocturnal Hypoxemia  RECOMMENDATIONS - Trial of BiPAP therapy on 22/18 cm H2O with a Large size Fisher&Paykel Full Face Mask Simplus mask and heated humidification. - Avoid alcohol, sedatives and other CNS depressants that may worsen sleep apnea and disrupt normal sleep architecture. - Sleep hygiene should be reviewed to assess factors that may improve sleep quality. - Weight management and regular exercise should be initiated or continued. - Return   Felesha Moncrieffe Mayford Knife Diplomate, American Board of Sleep Medicine  ELECTRONICALLY SIGNED ON:  11/08/2016, 8:59 PM [facility] PH: [phone]   FX: [phone] ACCREDITED BY THE AMERICAN ACADEMY OF SLEEP MEDICINE

## 2016-11-09 ENCOUNTER — Telehealth: Payer: Self-pay | Admitting: *Deleted

## 2016-11-09 ENCOUNTER — Other Ambulatory Visit (HOSPITAL_COMMUNITY): Payer: Self-pay | Admitting: Internal Medicine

## 2016-11-09 DIAGNOSIS — G4734 Idiopathic sleep related nonobstructive alveolar hypoventilation: Secondary | ICD-10-CM

## 2016-11-09 NOTE — Telephone Encounter (Deleted)
-----   Message from Quintella Reichert, MD sent at 11/08/2016  9:03 PM EDT ----- Please let patient know that he had successful BIPAP titration.  Please set patient up for OV in 10 weeks.  Please get an overnight pulse ox on BiPAP for nocturnal hypoxemia

## 2016-11-09 NOTE — Telephone Encounter (Signed)
-----   Message from Traci R Turner, MD sent at 11/08/2016  9:03 PM EDT ----- Please let patient know that he had successful BIPAP titration.  Please set patient up for OV in 10 weeks.  Please get an overnight pulse ox on BiPAP for nocturnal hypoxemia 

## 2016-11-09 NOTE — Telephone Encounter (Addendum)
Informed patient of Titration study results and patient understanding was verbalized. Patient understands he will be set up for an overnight pulse ox on BiPAP for low oxygen at night. Patient understands he will be contacted by [facility] for set up.  He understands to call if [facility] does not contact him with new setup in a timely manner. He understands he will be called once confirmation has been received from [facility] that he has received his new machine to schedule 10 week follow up appointment.  [facility] notified ONO order in epic Please add to [facility] He was grateful for the call and thanked me

## 2016-11-09 NOTE — Telephone Encounter (Signed)
LMTCB

## 2016-12-04 ENCOUNTER — Other Ambulatory Visit (HOSPITAL_COMMUNITY): Payer: Self-pay | Admitting: Adult Health

## 2016-12-04 MED ORDER — FUROSEMIDE 40 MG PO TABS: 40.0000 mg | ORAL_TABLET | Freq: Every day | ORAL | 5 refills | Status: DC

## 2016-12-04 MED ORDER — ISOSORBIDE MONONITRATE ER 60 MG PO TB24: 120.0000 mg | ORAL_TABLET | Freq: Every day | ORAL | 5 refills | Status: DC

## 2016-12-04 MED ORDER — HYDRALAZINE HCL 50 MG PO TABS: 75.0000 mg | ORAL_TABLET | Freq: Three times a day (TID) | ORAL | 5 refills | Status: DC

## 2016-12-07 ENCOUNTER — Telehealth: Payer: Self-pay | Admitting: Cardiology

## 2016-12-07 ENCOUNTER — Telehealth (HOSPITAL_COMMUNITY): Payer: Self-pay | Admitting: Pharmacist

## 2016-12-07 MED ORDER — SILDENAFIL CITRATE 25 MG PO TABS: 25.0000 mg | ORAL_TABLET | Freq: Every day | ORAL | 0 refills | Status: DC | PRN

## 2016-12-07 NOTE — Telephone Encounter (Signed)
Mr. Ehrsam called stating that he has a new girlfriend and has had some ED like symptoms which may be d/t his carvedilol. Per discussion with Dr. Gala Romney, will send in an Rx for sildenafil PRN. I have advised him not to take his isosorbide mononitrate on the days he wants to use sildenafil. He also asked if it would be ok for him to drink red wine with his girlfriend. I have advised him that it would be ok for him to drink a glass of red wine every once in a while but not to drink more than 2 glasses in 1 sitting.   [name]. Bonnye Fava, PharmD, BCPS, CPP Clinical Pharmacist Pager: [phone] Phone: [phone] 12/07/2016 2:31 PM

## 2016-12-07 NOTE — Telephone Encounter (Signed)
New message    Pt is calling asking for a call back. He said he can't breathe in his cpap or bipap machine, which ever one it is. He said the pressure is way to high. He wants to know what to do to fix it.

## 2016-12-08 ENCOUNTER — Encounter: Payer: Self-pay | Admitting: Cardiology

## 2016-12-11 ENCOUNTER — Other Ambulatory Visit (HOSPITAL_COMMUNITY): Payer: Self-pay | Admitting: Pharmacist

## 2016-12-11 MED ORDER — SACUBITRIL-VALSARTAN 97-103 MG PO TABS: 1.0000 | ORAL_TABLET | Freq: Two times a day (BID) | ORAL | 5 refills | Status: DC

## 2016-12-13 NOTE — Telephone Encounter (Signed)
-----   Message from Quintella Reichert, MD sent at 12/12/2016  2:28 PM EDT ----- Regarding: RE: CPAP Need to send back to sleep lab for repeat BiPAP titration  Traci ----- Message ----- From: Reesa Chew, CMA Sent: 12/12/2016   2:11 PM To: Quintella Reichert, MD, Reesa Chew, CMA Subject: CPAP                                           Patient states his pressure is too high he can not exhale.  BIPAP pressure is set on 22/18 cm H20.  Mask lifts off his face when he exhales.  Patient states he has not wore his machine, he was set up on 8/10.  Thanks

## 2016-12-13 NOTE — Telephone Encounter (Signed)
Yes, I did receive that order to repeat the BIPAP titration  Patient ask why does he have to go back to the lab for a  5th time when all he needs is the pressure decreased so he can breathe.      Quintella Reichert, MD  Reesa Chew, CMA        Please verify that I already sent you an order to do a repeat BiPAP titration in the lab since he cannot tolerate his current biPAP settings   Traci   Previous Messages    ----- Message -----  From: Reesa Chew, CMA  Sent: 12/13/2016 10:35 AM  To: Quintella Reichert, MD   FYI  ----- Message -----  From: Henderson Newcomer  Sent: 12/13/2016 10:01 AM  To: Reesa Chew, CMA   Good morning Coralee North!  Wanted to give you an update to pass along to Dr. Mayford Knife regarding this patient.   We set this patient up on a BiPAP and ONO. He stated he cannot tolerate the BiPAP pressures and when he did the ONO there was not enough data for the system to even allow [place] to download the unit. Could you maybe follow up with the patient on the pressure settings? Also let Dr. Mayford Knife know about the ONO being delayed?   Thank you! Hope you are doing well!

## 2016-12-17 NOTE — Telephone Encounter (Addendum)
RE: CPAP   Morgan, Antonio Bryant, MD  Reesa Chew, CMA        Because his sleep apnea is very bad and decreasing the pressure is not going to help   Traci    Patient states he needs to think about going back to the lab again. He will call our office when he is ready.

## 2016-12-20 ENCOUNTER — Telehealth: Payer: Self-pay | Admitting: *Deleted

## 2016-12-20 DIAGNOSIS — G4733 Obstructive sleep apnea (adult) (pediatric): Secondary | ICD-10-CM

## 2016-12-20 NOTE — Telephone Encounter (Signed)
-----   Message from Quintella Reichert, MD sent at 12/14/2016  7:16 AM EDT ----- Regarding: RE: CPAP Because his sleep apnea is very bad and decreasing the pressure is not going to help  Traci ----- Message ----- From: Reesa Chew, CMA Sent: 12/13/2016   5:20 PM To: Quintella Reichert, MD Subject: RE: CPAP                                       Patient ask why does he have to go back to the lab for a  5th time when all he needs is the pressure decreased so he can breathe.  ----- Message ----- From: Quintella Reichert, MD Sent: 12/12/2016   2:28 PM To: Reesa Chew, CMA Subject: RE: CPAP                                       Need to send back to sleep lab for repeat BiPAP titration  Traci ----- Message ----- From: Reesa Chew, CMA Sent: 12/12/2016   2:11 PM To: Quintella Reichert, MD, Reesa Chew, CMA Subject: CPAP                                           Patient states his pressure is too high he can not exhale.  BIPAP pressure is set on 22/18 cm H20.  Mask lifts off his face when he exhales.  Patient states he has not wore his machine, he was set up on 8/10.  Thanks

## 2016-12-20 NOTE — Telephone Encounter (Signed)
Patient states he needs to call our office back for the repeat BIPAP visit.

## 2017-01-03 ENCOUNTER — Other Ambulatory Visit (HOSPITAL_COMMUNITY): Payer: Self-pay | Admitting: Pharmacist

## 2017-01-03 MED ORDER — CARVEDILOL 25 MG PO TABS: 25.0000 mg | ORAL_TABLET | Freq: Two times a day (BID) | ORAL | 5 refills | Status: DC

## 2017-01-09 ENCOUNTER — Telehealth (HOSPITAL_COMMUNITY): Payer: Self-pay | Admitting: Pharmacist

## 2017-01-09 NOTE — Telephone Encounter (Signed)
Entresto PA approved by [employer] [employer] through 01/09/18.   [name]. Bonnye Fava, PharmD, BCPS, CPP Clinical Pharmacist Pager: [phone] Phone: [phone] 01/09/2017 1:10 PM

## 2017-01-22 NOTE — Telephone Encounter (Signed)
No he needs to have BiPAP titration

## 2017-01-22 NOTE — Telephone Encounter (Signed)
Dr Mayford Knife this patient never went back to the sleep center for his repeat BIPAP Titration so do you still want to see him for his 10 week follow up visit?

## 2017-01-25 ENCOUNTER — Ambulatory Visit (HOSPITAL_COMMUNITY)
Admission: RE | Admit: 2017-01-25 | Discharge: 2017-01-25 | Disposition: A | Discharge status: Home / Self Care | Payer: BC Managed Care – PPO | Source: Ambulatory Visit | Attending: Internal Medicine | Admitting: Internal Medicine

## 2017-01-25 ENCOUNTER — Encounter: Payer: Self-pay | Admitting: *Deleted

## 2017-01-25 ENCOUNTER — Encounter (HOSPITAL_COMMUNITY): Payer: Self-pay | Admitting: Internal Medicine

## 2017-01-25 VITALS — BP 150/85 | HR 83 | Wt 338.0 lb

## 2017-01-25 DIAGNOSIS — Z79899 Other long term (current) drug therapy: Secondary | ICD-10-CM | POA: Diagnosis not present

## 2017-01-25 DIAGNOSIS — I38 Endocarditis, valve unspecified: Secondary | ICD-10-CM

## 2017-01-25 DIAGNOSIS — Z791 Long term (current) use of non-steroidal anti-inflammatories (NSAID): Secondary | ICD-10-CM | POA: Diagnosis not present

## 2017-01-25 DIAGNOSIS — I110 Hypertensive heart disease with heart failure: Secondary | ICD-10-CM | POA: Diagnosis not present

## 2017-01-25 DIAGNOSIS — I5022 Chronic systolic (congestive) heart failure: Secondary | ICD-10-CM | POA: Insufficient documentation

## 2017-01-25 DIAGNOSIS — Z87891 Personal history of nicotine dependence: Secondary | ICD-10-CM | POA: Diagnosis not present

## 2017-01-25 DIAGNOSIS — Z9981 Dependence on supplemental oxygen: Secondary | ICD-10-CM | POA: Insufficient documentation

## 2017-01-25 DIAGNOSIS — Z8249 Family history of ischemic heart disease and other diseases of the circulatory system: Secondary | ICD-10-CM | POA: Diagnosis not present

## 2017-01-25 DIAGNOSIS — M109 Gout, unspecified: Secondary | ICD-10-CM | POA: Diagnosis not present

## 2017-01-25 DIAGNOSIS — G4733 Obstructive sleep apnea (adult) (pediatric): Secondary | ICD-10-CM | POA: Diagnosis not present

## 2017-01-25 DIAGNOSIS — I10 Essential (primary) hypertension: Secondary | ICD-10-CM

## 2017-01-25 DIAGNOSIS — I429 Cardiomyopathy, unspecified: Secondary | ICD-10-CM | POA: Insufficient documentation

## 2017-01-25 LAB — BASIC METABOLIC PANEL
Anion gap: 8 (ref 5–15)
BUN: 14 mg/dL (ref 6–20)
CALCIUM: 9.3 mg/dL (ref 8.9–10.3)
CO2: 27 mmol/L (ref 22–32)
CREATININE: 1.17 mg/dL (ref 0.61–1.24)
Chloride: 103 mmol/L (ref 101–111)
GFR calc Af Amer: >60 mL/min (ref >60)
Glucose, Bld: 97 mg/dL (ref 65–99)
POTASSIUM: 4 mmol/L (ref 3.5–5.1)
SODIUM: 138 mmol/L (ref 135–145)

## 2017-01-25 NOTE — Progress Notes (Signed)
PCP: Dr Juanetta Gosling  Primary Cardiologist:Dr Keliyah Spillman   HPI: Antonio Morgan a 36 year old male with a past medical history of HTN and obesity diagnosed with acute systolic heart failure august 6553.   He presented to [facility] ED on 12/08/2015 for worsening dyspnea over the last year.  On admit , he was tachycardiac into the low-100's. Oxygen saturations were initially 85%. CXR on admit showed vascular congestion. CTA was negative for PE. ECHO was performed and showed severely reduced LVEF 10-15% and RV moderately/severely dilated. Diuresed with IV lasix and transitioned to lasix 40 mg twice daily. RHC/LHC 12/13/2015 with normal cors and well compensated hemodynamics. Also discharged on 2 liters oxygen continuously. Discharge weight was 352 pounds.   Today he returns for HF follow up.Overall feeling good. Denies SOB/PND/Orthopnea. Having trouble with CPAP. Planning on another sleep study in November. SBP at home 120s or lower at least a few days a week. Taking all medications.  Working full time.   ECHO 12/09/2015 10-15% RV mod-severely dilated.  ECHO 03/23/2016 EF 35-40%   RHC/LHC 12/13/2015 RA = 6 RV = 44/8/10 PA = 43/17 (31) PCW = 13 Fick cardiac output/index = 7.6/2.8 PVR = 2.4 WU SVR = 855  Ao sat = 94%  PA sat = 67%, 70% Assessment: 1. Essentially normal coronaries 2. Well compensated hemodynamics with mild PAH   ROS: All systems negative except as listed in HPI, PMH and Problem List.  SH:  Social History   Social History  . Marital status: Single    Spouse name: N/A  . Number of children: N/A  . Years of education: 32   Occupational History  . guitarist    Social History Main Topics  . Smoking status: Former Smoker    Packs/day: 1.00    Years: 10.00    Quit date: 04/23/2012  . Smokeless tobacco: Never Used  . Alcohol use No  . Drug use: No  . Sexual activity: Not on file   Other Topics Concern  . Not on file   Social History Narrative  . No narrative on file     FH:  Family History  Problem Relation Age of Onset  . Heart disease Father        heart transplant  . Hypertension Mother     Past Medical History:  Diagnosis Date  . Gout   . Hypertension   . Obese     Current Outpatient Prescriptions  Medication Sig Dispense Refill  . acetaminophen (TYLENOL) 325 MG tablet Take 650 mg by mouth every 6 (six) hours as needed for mild pain.     Marland Kitchen allopurinol (ZYLOPRIM) 100 MG tablet Take 2 tablets (200 mg total) by mouth daily. 60 tablet 5  . carvedilol (COREG) 25 MG tablet Take 1 tablet (25 mg total) by mouth 2 (two) times daily. 60 tablet 5  . colchicine 0.6 MG tablet Take 1 tablet (0.6 mg total) by mouth daily as needed (gout). 30 tablet 6  . furosemide (LASIX) 40 MG tablet Take 1 tablet (40 mg total) by mouth daily. 30 tablet 5  . hydrALAZINE (APRESOLINE) 50 MG tablet Take 1.5 tablets (75 mg total) by mouth 3 (three) times daily. 135 tablet 5  . indomethacin (INDOCIN) 25 MG capsule Take 25 mg by mouth 2 (two) times daily with a meal.    . isosorbide mononitrate (IMDUR) 60 MG 24 hr tablet Take 2 tablets (120 mg total) by mouth daily. 60 tablet 5  . OXYGEN 3lpm 24/7  [facility]    . sacubitril-valsartan (ENTRESTO) 97-103 MG Take 1 tablet by mouth 2 (two) times daily. 60 tablet 5  . sildenafil (VIAGRA) 25 MG tablet Take 1 tablet (25 mg total) by mouth daily as needed for erectile dysfunction. Do not take isosorbide on sildenafil days. 10 tablet 0  . spironolactone (ALDACTONE) 25 MG tablet Take 1 tablet (25 mg total) by mouth daily. 30 tablet 5   No current facility-administered medications for this encounter.     Vitals:   01/25/17 1451  BP: (!) 150/85  Pulse: 83  SpO2: 99%  Weight: (!) 338 lb (153.3 kg)   Filed Weights   01/25/17 1451  Weight: (!) 338 lb (153.3 kg)    PHYSICAL EXAM: General:  Well appearing. No resp difficulty HEENT: normal Neck: supple. no JVD. Carotids 2+ bilat; no bruits. No lymphadenopathy or thryomegaly  appreciated. Cor: PMI nondisplaced. Regular rate & rhythm. No rubs, gallops or murmurs. Lungs: clear Abdomen: obese, soft, nontender, nondistended. No hepatosplenomegaly. No bruits or masses. Good bowel sounds. Extremities: no cyanosis, clubbing, rash, edema Neuro: alert & orientedx3, cranial nerves grossly intact. moves all 4 extremities w/o difficulty. Affect pleasant   ASSESSMENT & PLAN: 1. Chronic Systolic Heart Failure- NICM - cors ok on 12/09/2015. ECHO 12/09/2015 EF 10-15%. ECHO 12/17. EF improved in  35-40%   NYHA II. Volume status stable.  Continue lasix 40 mg daily and 25 mg spiro daily.  On goal dose of coreg 25 mg twice a day, hydralazine 75 mg tid and imdur 120 mg daily, and entresto 97-103 twice a day.  Today we reinforced daily weights, low salt diet, and limiting fluid intake to < 2 liters per day. 2. HTN- Elevated but at home stable SBP 120.  3. OSA-Has repeat study in Novmeber 4. Respiratory Failure- hypoxia--> resolved.   5. Morbid Obesity - needs to lose weight. Discussed portion control. 6. Gout: Continue colchicine + allopurinol     Follow up in  6 months.   Amy Clegg NP-C  2:54 PM   Patient seen and examined with Tonye Becket, NP. We discussed all aspects of the encounter. I agree with the assessment and plan as stated above.   Continues to do very well. NYHA II. Volume status looks good. Continue current regimen. RTC in 6 months with repeat echo.   Arvilla Meres, MD  7:13 PM

## 2017-01-25 NOTE — Patient Instructions (Signed)
Labs drawn today (if we do not call you, then your lab work was stable)   Your physician has requested that you have an echocardiogram. Echocardiography is a painless test that uses sound waves to create images of your heart. It provides your doctor with information about the size and shape of your heart and how well your heart's chambers and valves are working. This procedure takes approximately one hour. There are no restrictions for this procedure.  Your physician recommends that you schedule a follow-up appointment in: APP with an echocardiogram

## 2017-01-25 NOTE — Telephone Encounter (Addendum)
Patient states he can't use his BIPAP because it blows too hard.  Patient has agreed to go back to the lab for his repeat BIPAP Titration.  CPAP assistant will schedule this asap.

## 2017-01-25 NOTE — Telephone Encounter (Signed)
This encounter was created in error - please disregard.

## 2017-01-31 ENCOUNTER — Encounter: Payer: Self-pay | Admitting: *Deleted

## 2017-01-31 NOTE — Telephone Encounter (Addendum)
Informed patient of upcoming BIPAP TITRATION and patient understanding was verbalized. Patient understands his sleep study is scheduled for Friday March 22 2017. Patient understands his sleep study will be done at [facility] sleep lab. Patient understands he will receive a sleep packet in a week or so. Patient understands to call if he does not receive the sleep packet in a timely manner. Patient agrees with treatment and thanked me for call.

## 2017-01-31 NOTE — Addendum Note (Signed)
Addended by: Reesa Chew on: 01/31/2017 11:01 AM   Modules accepted: Orders

## 2017-02-11 ENCOUNTER — Emergency Department (HOSPITAL_COMMUNITY)
Admission: EM | Admit: 2017-02-11 | Discharge: 2017-02-11 | Disposition: A | Discharge status: Home / Self Care | Payer: BC Managed Care – PPO | Attending: Emergency Medicine | Admitting: Emergency Medicine

## 2017-02-11 ENCOUNTER — Encounter (HOSPITAL_COMMUNITY): Payer: Self-pay

## 2017-02-11 ENCOUNTER — Emergency Department (HOSPITAL_COMMUNITY): Payer: BC Managed Care – PPO

## 2017-02-11 DIAGNOSIS — Z87891 Personal history of nicotine dependence: Secondary | ICD-10-CM | POA: Diagnosis not present

## 2017-02-11 DIAGNOSIS — Z79899 Other long term (current) drug therapy: Secondary | ICD-10-CM | POA: Insufficient documentation

## 2017-02-11 DIAGNOSIS — I110 Hypertensive heart disease with heart failure: Secondary | ICD-10-CM | POA: Insufficient documentation

## 2017-02-11 DIAGNOSIS — M25512 Pain in left shoulder: Secondary | ICD-10-CM | POA: Insufficient documentation

## 2017-02-11 DIAGNOSIS — I509 Heart failure, unspecified: Secondary | ICD-10-CM | POA: Diagnosis not present
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
## 2017-02-11 NOTE — ED Notes (Signed)
Patient able to ambulate independently  

## 2017-02-11 NOTE — Discharge Instructions (Signed)
The x-ray of your left shoulder does not show fracture or dislocation. Your injury is consistent with soft tissue injury in the shoulder muscles. This will take time to resolve.   Please use the attached guide to complete shoulder range of motion exercises for the next week. It is important to do these to prevent frozen shoulder. You can also take up to 4000 mg of Tylenol daily to help with pain. Apply heat over the shoulder each day for the next several days to assist in pain.  Below his the information to [facility] wellness. This is located across the street. Please call to establish care and for follow-up of your left shoulder pain. Your blood pressure was elevated in the ER today. Please have this rechecked.   Return to the ER for any new or worsening symptoms.

## 2017-02-11 NOTE — ED Notes (Signed)
PA at bedside.

## 2017-03-20 ENCOUNTER — Telehealth (HOSPITAL_COMMUNITY): Payer: Self-pay | Admitting: Pharmacist

## 2017-03-20 MED ORDER — SILDENAFIL CITRATE 100 MG PO TABS: 100.0000 mg | ORAL_TABLET | Freq: Every day | ORAL | 5 refills | Status: DC | PRN

## 2017-03-20 NOTE — Telephone Encounter (Signed)
Antonio Morgan called stating that he is still having ED even on 75 mg of Viagra. He was wondering if he could try Viagra 100 mg and after speaking with Dr. Gala Romney, will send new Rx to pharmacy for him. He also stated that he read that carvedilol could be contributing to his ED and was wondering if he could switch to valsartan. I explained to him the difference between carvedilol and valsartan and that the two are not interchangeable. He verbalized understanding and was agreeable with the plan.   [name]. Bonnye Fava, PharmD, BCPS, CPP Clinical Pharmacist Pager: [phone] Phone: [phone] 03/20/2017 4:46 PM

## 2017-03-22 ENCOUNTER — Ambulatory Visit (HOSPITAL_BASED_OUTPATIENT_CLINIC_OR_DEPARTMENT_OTHER): Payer: BC Managed Care – PPO | Attending: Cardiology | Admitting: Cardiology

## 2017-03-22 VITALS — Ht 74.0 in | Wt 325.0 lb

## 2017-03-22 DIAGNOSIS — G4733 Obstructive sleep apnea (adult) (pediatric): Secondary | ICD-10-CM | POA: Diagnosis not present

## 2017-04-09 ENCOUNTER — Encounter (HOSPITAL_COMMUNITY): Payer: Self-pay | Admitting: Emergency Medicine

## 2017-04-09 ENCOUNTER — Other Ambulatory Visit: Payer: Self-pay

## 2017-04-09 ENCOUNTER — Emergency Department (HOSPITAL_COMMUNITY)
Admission: EM | Admit: 2017-04-09 | Discharge: 2017-04-09 | Disposition: A | Discharge status: Home / Self Care | Payer: BC Managed Care – PPO | Attending: Emergency Medicine | Admitting: Emergency Medicine

## 2017-04-09 ENCOUNTER — Emergency Department (HOSPITAL_COMMUNITY): Payer: BC Managed Care – PPO

## 2017-04-09 DIAGNOSIS — Z79899 Other long term (current) drug therapy: Secondary | ICD-10-CM | POA: Insufficient documentation

## 2017-04-09 DIAGNOSIS — I10 Essential (primary) hypertension: Secondary | ICD-10-CM | POA: Insufficient documentation

## 2017-04-09 DIAGNOSIS — M25512 Pain in left shoulder: Secondary | ICD-10-CM

## 2017-04-09 DIAGNOSIS — I509 Heart failure, unspecified: Secondary | ICD-10-CM | POA: Diagnosis not present

## 2017-04-09 DIAGNOSIS — Z87891 Personal history of nicotine dependence: Secondary | ICD-10-CM | POA: Insufficient documentation

## 2017-04-09 MED ORDER — ACETAMINOPHEN 500 MG PO TABS
1000.0000 mg | ORAL_TABLET | Freq: Once | ORAL | Status: AC
  Administered 2017-04-09: 1000 mg via ORAL
  Filled 2017-04-09: qty 2

## 2017-04-09 MED ORDER — LIDOCAINE 5 % EX PTCH: 1.0000 | MEDICATED_PATCH | CUTANEOUS | 0 refills | Status: DC

## 2017-04-09 NOTE — Discharge Instructions (Signed)
Your x-ray was unremarkable.  This is likely a rotator cuff problem or inflammation of your bicep tendon.  Make sure you are doing Motrin and Tylenol at home for pain.  Cold compress to the area.  Perform the range of motion exercises as performed to prevent any frozen shoulder.  Have given you lidocaine patches to apply to the painful area.  If you develop any worsening fevers, redness over the shoulder or for any other reason return to the ED immediately.  Follow-up with orthopedic doctor.
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
## 2017-04-10 NOTE — Procedures (Signed)
   NAME: Antonio Morgan DATE OF BIRTH:  06-15-1980 MEDICAL RECORD NUMBER [id number]  LOCATION: [facility]  PHYSICIAN: Markeisha Mancias  DATE OF STUDY: 03/22/2017  CLINICAL INFORMATION The patient is referred for a BiPAP titration to treat sleep apnea.  SLEEP STUDY TECHNIQUE As per the AASM Manual for the Scoring of Sleep and Associated Events v2.3 (April 2016) with a hypopnea requiring 4% desaturations.  The channels recorded and monitored were frontal, central and occipital EEG, electrooculogram (EOG), submentalis EMG (chin), nasal and oral airflow, thoracic and abdominal wall motion, anterior tibialis EMG, snore microphone, electrocardiogram, and pulse oximetry. Bilevel positive airway pressure (BPAP) was initiated at the beginning of the study and titrated to treat sleep-disordered breathing.  MEDICATIONS Medications self-administered by patient taken the night of the study : CARVEDILOL, HYDRALAZINE  RESPIRATORY PARAMETERS Optimal IPAP Pressure (cm): 21  AHI at Optimal Pressure (/hr) 0 Optimal EPAP Pressure (cm):17  Overall Minimal O2 (%):61.00  Minimal O2 at Optimal Pressure (%): 91.0  SLEEP ARCHITECTURE Start Time:10:45:12 PM  Stop Time:5:01:09 AM  Total Time (min):375.9  Total Sleep Time (min):349.5 Sleep Latency (min):1.8  Sleep Efficiency (%):93.0  REM Latency (min):123.0  WASO (min): 24.7 Stage N1 (%): 7.73  Stage N2 (%): 66.67  Stage N3 (%): 0.00  Stage R (%): 25.61 Supine (%):72.39  Arousal Index (/hr):19.2   CARDIAC DATA The 2 lead EKG demonstrated sinus rhythm. The mean heart rate was 72.85 beats per minute. Other EKG findings include: PVC.  LEG MOVEMENT DATA The total Periodic Limb Movements of Sleep (PLMS) were 0. The PLMS index was 0.00. A PLMS index of <15 is considered normal in adults.  IMPRESSIONS - An optimal PAP pressure was selected for this patient ( 21/17 cm of water) - Central sleep apnea was not noted during this  titration (CAI = 3.9/h). - Severe oxygen desaturations were observed during this titration (min O2 = 61.00%). - The patient snored with loud snoring volume. - Rare PVCs were observed during this study. - Clinically significant periodic limb movements were not noted during this study. Arousals associated with PLMs were rare.  DIAGNOSIS - Obstructive Sleep Apnea (327.23 [G47.33 ICD-10])  RECOMMENDATIONS - Trial of BiPAP therapy on 21/17 cm H2O with a Large size Fisher[employer]Paykel Full Face Mask Simplus mask and heated humidification. - Avoid alcohol, sedatives and other CNS depressants that may worsen sleep apnea and disrupt normal sleep architecture. - Sleep hygiene should be reviewed to assess factors that may improve sleep quality. - Weight management and regular exercise should be initiated or continued. - Return to Sleep Center for re-evaluation after 10 weeks of therapy   Armanda Magic Diplomate, American Board of Sleep Medicine  ELECTRONICALLY SIGNED ON:  04/10/2017, 3:32 PM [facility] PH: [phone]   FX: [phone] ACCREDITED BY THE AMERICAN ACADEMY OF SLEEP MEDICINE

## 2017-04-11 ENCOUNTER — Telehealth: Payer: Self-pay | Admitting: *Deleted

## 2017-04-11 NOTE — Telephone Encounter (Signed)
Informed patient of titration results and verbalized understanding was indicated. Patient understands he had a successful BiPAP Titration and Dr Mayford Knife has ordered him a BiPAP unit. Patient understands he will be contacted by [facility] to set up his bipap. He understands to call if [facility] does not contact him with new setup in a timely manner. He understands he will be called once confirmation has been received from [facility] that he has received his new machine to schedule 10 week follow up appointment.  [facility] notified of new cpap order in epic Please add to [facility] He was grateful for the call and thanked me.

## 2017-04-11 NOTE — Telephone Encounter (Signed)
LMTCB on [phone].

## 2017-04-11 NOTE — Telephone Encounter (Signed)
-----   Message from Quintella Reichert, MD sent at 04/10/2017  3:35 PM EST ----- Please let patient know that they had a successful PAP titration and let DME know that orders are in EPIC.  Please set up 10 week OV with me.

## 2017-04-13 ENCOUNTER — Other Ambulatory Visit (HOSPITAL_COMMUNITY): Payer: Self-pay | Admitting: Adult Health

## 2017-04-22 ENCOUNTER — Ambulatory Visit (INDEPENDENT_AMBULATORY_CARE_PROVIDER_SITE_OTHER): Payer: Self-pay | Admitting: Orthopaedic Surgery

## 2017-04-29 ENCOUNTER — Ambulatory Visit (INDEPENDENT_AMBULATORY_CARE_PROVIDER_SITE_OTHER): Payer: BC Managed Care – PPO | Admitting: Orthopaedic Surgery

## 2017-04-29 ENCOUNTER — Encounter (INDEPENDENT_AMBULATORY_CARE_PROVIDER_SITE_OTHER): Payer: Self-pay | Admitting: Orthopaedic Surgery

## 2017-04-29 ENCOUNTER — Ambulatory Visit (INDEPENDENT_AMBULATORY_CARE_PROVIDER_SITE_OTHER): Payer: BC Managed Care – PPO

## 2017-04-29 VITALS — Ht 74.0 in | Wt 320.0 lb

## 2017-04-29 DIAGNOSIS — M7582 Other shoulder lesions, left shoulder: Secondary | ICD-10-CM | POA: Insufficient documentation

## 2017-04-29 DIAGNOSIS — M25561 Pain in right knee: Secondary | ICD-10-CM

## 2017-04-29 MED ORDER — BUPIVACAINE HCL 0.25 % IJ SOLN
2.0000 mL | INTRAMUSCULAR | Status: AC | PRN
  Administered 2017-04-29: 2 mL via INTRA_ARTICULAR

## 2017-04-29 MED ORDER — LIDOCAINE HCL 1 % IJ SOLN
2.0000 mL | INTRAMUSCULAR | Status: AC | PRN
  Administered 2017-04-29: 2 mL

## 2017-04-29 MED ORDER — METHYLPREDNISOLONE ACETATE 40 MG/ML IJ SUSP
40.0000 mg | INTRAMUSCULAR | Status: AC | PRN
  Administered 2017-04-29: 40 mg via INTRA_ARTICULAR

## 2017-04-29 MED ORDER — LIDOCAINE HCL 2 % IJ SOLN
2.0000 mL | INTRAMUSCULAR | Status: AC | PRN
  Administered 2017-04-29: 2 mL
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
## 2017-05-29 ENCOUNTER — Other Ambulatory Visit: Payer: Self-pay

## 2017-05-29 ENCOUNTER — Emergency Department (HOSPITAL_COMMUNITY)
Admission: EM | Admit: 2017-05-29 | Discharge: 2017-05-29 | Disposition: A | Discharge status: Home / Self Care | Payer: BC Managed Care – PPO | Attending: Emergency Medicine | Admitting: Emergency Medicine

## 2017-05-29 ENCOUNTER — Encounter (HOSPITAL_COMMUNITY): Payer: Self-pay

## 2017-05-29 DIAGNOSIS — Z87891 Personal history of nicotine dependence: Secondary | ICD-10-CM | POA: Diagnosis not present

## 2017-05-29 DIAGNOSIS — Z79899 Other long term (current) drug therapy: Secondary | ICD-10-CM | POA: Insufficient documentation

## 2017-05-29 DIAGNOSIS — J111 Influenza due to unidentified influenza virus with other respiratory manifestations: Secondary | ICD-10-CM | POA: Insufficient documentation

## 2017-05-29 DIAGNOSIS — I509 Heart failure, unspecified: Secondary | ICD-10-CM | POA: Diagnosis not present

## 2017-05-29 DIAGNOSIS — I110 Hypertensive heart disease with heart failure: Secondary | ICD-10-CM | POA: Insufficient documentation

## 2017-05-29 DIAGNOSIS — R69 Illness, unspecified: Secondary | ICD-10-CM

## 2017-05-29 DIAGNOSIS — R05 Cough: Secondary | ICD-10-CM | POA: Diagnosis present

## 2017-05-29 MED ORDER — OSELTAMIVIR PHOSPHATE 75 MG PO CAPS: 75.0000 mg | ORAL_CAPSULE | Freq: Two times a day (BID) | ORAL | 0 refills | Status: DC

## 2017-05-29 MED ORDER — IBUPROFEN 600 MG PO TABS: 600.0000 mg | ORAL_TABLET | Freq: Four times a day (QID) | ORAL | 0 refills | Status: DC | PRN

## 2017-05-29 MED ORDER — IBUPROFEN 800 MG PO TABS
800.0000 mg | ORAL_TABLET | Freq: Once | ORAL | Status: AC
  Administered 2017-05-29: 800 mg via ORAL
  Filled 2017-05-29: qty 1

## 2017-05-29 NOTE — ED Triage Notes (Signed)
Pt presents to the ed with complaints of cough, chills and body ache for a few days.  The patient also threw up once today.  Denies any other symptoms

## 2017-05-29 NOTE — ED Provider Notes (Signed)
[facility] EMERGENCY DEPARTMENT Provider Note   CSN: [id number] Arrival date & time: 05/29/17  1549     History   Chief Complaint Chief Complaint  Patient presents with  . Cough    HPI Antonio Morgan is a 37 y.o. male.  HPI   37 year old male presenting with flulike symptoms.  Patient reports for the past 2 days he has had fever, chills, mild headache, sinus congestion, runny nose, throat irritation, nonproductive cough, body aches and decreased appetite.  States that his girlfriend has similar symptoms.  He denies any specific treatment tried.  He denies vomiting or diarrhea or dysuria.  No shortness of breath or pleuritic chest pain.  He did not get his flu shot this year.  Denies alcohol abuse or tobacco abuse.  Past Medical History:  Diagnosis Date  . Gout   . Hypertension   . Obese     Patient Active Problem List   Diagnosis Date Noted  . Acute pain of right knee 04/29/2017  . Rotator cuff tendinitis, left 04/29/2017  . Chronic respiratory failure with hypoxia (HCC) 12/29/2015  . Acute congestive heart failure (HCC) 12/09/2015  . CHF (congestive heart failure) (HCC) 12/09/2015  . DOE (dyspnea on exertion) 01/28/2014  . Morbid obesity (HCC) 01/28/2014  . Essential hypertension, malignant 01/28/2014  . Elevated brain natriuretic peptide (BNP) level 01/28/2014  . Gout 01/28/2014    Past Surgical History:  Procedure Laterality Date  . CARDIAC CATHETERIZATION N/A 12/13/2015   Procedure: Right/Left Heart Cath and Coronary Angiography;  Surgeon: Dolores Patty, MD;  Location: [facility] INVASIVE CV LAB;  Service: Cardiovascular;  Laterality: N/A;       Home Medications    Prior to Admission medications   Medication Sig Start Date End Date Taking? Authorizing Provider  acetaminophen (TYLENOL) 325 MG tablet Take 650 mg by mouth every 6 (six) hours as needed for mild pain.     [provider]  allopurinol (ZYLOPRIM) 100 MG tablet Take 2  tablets (200 mg total) by mouth daily. Patient not taking: Reported on 04/29/2017 05/24/16   Bensimhon, Bevelyn Buckles, MD  carvedilol (COREG) 25 MG tablet Take 1 tablet (25 mg total) by mouth 2 (two) times daily. 01/03/17   Bensimhon, Bevelyn Buckles, MD  colchicine 0.6 MG tablet TAKE 1 TABLET DAILY AS NEEDED FOR GOUT Patient not taking: Reported on 04/29/2017 04/18/17   Bensimhon, Bevelyn Buckles, MD  furosemide (LASIX) 40 MG tablet Take 1 tablet (40 mg total) by mouth daily. 12/04/16   Bensimhon, Bevelyn Buckles, MD  hydrALAZINE (APRESOLINE) 50 MG tablet Take 1.5 tablets (75 mg total) by mouth 3 (three) times daily. 12/04/16 03/04/17  Bensimhon, Bevelyn Buckles, MD  indomethacin (INDOCIN) 25 MG capsule Take 25 mg by mouth 2 (two) times daily with a meal.    [provider]  isosorbide mononitrate (IMDUR) 60 MG 24 hr tablet Take 2 tablets (120 mg total) by mouth daily. 12/04/16 03/04/17  Bensimhon, Bevelyn Buckles, MD  lidocaine (LIDODERM) 5 % Place 1 patch onto the skin daily. Remove & Discard patch within 12 hours or as directed by MD 04/09/17   Rise Mu, PA-C  OXYGEN 3lpm 24/7  [facility]    [provider]  predniSONE (DELTASONE) 10 MG tablet  04/24/17   [provider]  sacubitril-valsartan (ENTRESTO) 97-103 MG Take 1 tablet by mouth 2 (two) times daily. 12/11/16   Bensimhon, Bevelyn Buckles, MD  sildenafil (VIAGRA) 100 MG tablet Take 1 tablet (100 mg total)  by mouth daily as needed for erectile dysfunction. DO NOT take on days that you take isosorbide mononitrate. 03/20/17   Bensimhon, Bevelyn Buckles, MD  spironolactone (ALDACTONE) 25 MG tablet Take 1 tablet (25 mg total) by mouth daily. 12/04/16   Bensimhon, Bevelyn Buckles, MD    Family History Family History  Problem Relation Age of Onset  . Heart disease Father        heart transplant  . Hypertension Mother     Social History Social History   Tobacco Use  . Smoking status: Former Smoker    Packs/day: 1.00    Years: 10.00    Pack years: 10.00    Last attempt to  quit: 04/23/2012    Years since quitting: 5.1  . Smokeless tobacco: Never Used  Substance Use Topics  . Alcohol use: No  . Drug use: No     Allergies   Hydrochlorothiazide and Lisinopril   Review of Systems Review of Systems  All other systems reviewed and are negative.    Physical Exam Updated Vital Signs BP 128/74 (BP Location: Right Arm)   Pulse 96   Temp (!) 103 F (39.4 C) (Oral)   Resp 18   Ht 6' 1.5" (1.867 m)   Wt 136.1 kg (300 lb)   SpO2 93%   BMI 39.04 kg/m   Physical Exam  Constitutional: He is oriented to person, place, and time. He appears well-developed and well-nourished. No distress.  Nontoxic in appearance  HENT:  Head: Atraumatic.  Right Ear: External ear normal.  Left Ear: External ear normal.  Nose: Nose normal.  Mouth/Throat: Oropharynx is clear and moist.  Eyes: Conjunctivae are normal.  Neck: Normal range of motion. Neck supple.  No nuchal rigidity  Cardiovascular: Normal rate and regular rhythm.  Pulmonary/Chest: Effort normal and breath sounds normal.  Abdominal: Soft. He exhibits no distension. There is no tenderness.  Neurological: He is alert and oriented to person, place, and time.  Skin: Skin is warm. No rash noted.  Psychiatric: He has a normal mood and affect.  Nursing note and vitals reviewed.    ED Treatments / Results  Labs (all labs ordered are listed, but only abnormal results are displayed) Labs Reviewed - No data to display  EKG  EKG Interpretation None       Radiology No results found.  Procedures Procedures (including critical care time)  Medications Ordered in ED Medications - No data to display   Initial Impression / Assessment and Plan / ED Course  I have reviewed the triage vital signs and the nursing notes.  Pertinent labs & imaging results that were available during my care of the patient were reviewed by me and considered in my medical decision making (see chart for details).     BP 128/74  (BP Location: Right Arm)   Pulse 96   Temp (!) 103 F (39.4 C) (Oral)   Resp 18   Ht 6' 1.5" (1.867 m)   Wt 136.1 kg (300 lb)   SpO2 93%   BMI 39.04 kg/m    Final Clinical Impressions(s) / ED Diagnoses   Final diagnoses:  Influenza-like illness    ED Discharge Orders        Ordered    ibuprofen (ADVIL,MOTRIN) 600 MG tablet  Every 6 hours PRN     05/29/17 1739    oseltamivir (TAMIFLU) 75 MG capsule  Every 12 hours     05/29/17 1739     Patient with symptoms consistent with  influenza.  Vitals are stable, low-grade fever.  No signs of dehydration, tolerating PO's.  Lungs are clear. Due to patient's presentation and physical exam a chest x-ray was not ordered bc likely diagnosis of flu.  Discussed the cost versus benefit of Tamiflu treatment with the patient.  The patient understands that symptoms are greater than the recommended 24-48 hour window of treatment.  Patient will be discharged with instructions to orally hydrate, rest, and use over-the-counter medications such as anti-inflammatories ibuprofen and Aleve for muscle aches and Tylenol for fever.  Patient will also be given a cough suppressant.     Fayrene Helper, PA-C 05/29/17 1742    Margarita Grizzle, MD 06/01/17 1225

## 2017-06-03 ENCOUNTER — Other Ambulatory Visit (HOSPITAL_COMMUNITY): Payer: Self-pay | Admitting: Internal Medicine

## 2017-06-21 ENCOUNTER — Encounter (INDEPENDENT_AMBULATORY_CARE_PROVIDER_SITE_OTHER): Payer: Self-pay | Admitting: Orthopaedic Surgery

## 2017-06-21 ENCOUNTER — Ambulatory Visit (INDEPENDENT_AMBULATORY_CARE_PROVIDER_SITE_OTHER): Payer: BC Managed Care – PPO | Admitting: Orthopaedic Surgery

## 2017-06-21 DIAGNOSIS — M7582 Other shoulder lesions, left shoulder: Secondary | ICD-10-CM | POA: Diagnosis not present

## 2017-06-21 DIAGNOSIS — M25512 Pain in left shoulder: Secondary | ICD-10-CM

## 2017-06-21 DIAGNOSIS — G8929 Other chronic pain: Secondary | ICD-10-CM | POA: Diagnosis not present
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
## 2017-07-05 ENCOUNTER — Other Ambulatory Visit (HOSPITAL_COMMUNITY): Payer: Self-pay | Admitting: Internal Medicine

## 2017-07-08 ENCOUNTER — Other Ambulatory Visit (HOSPITAL_COMMUNITY): Payer: Self-pay | Admitting: Pharmacist

## 2017-07-08 MED ORDER — ISOSORBIDE MONONITRATE ER 60 MG PO TB24: 120.0000 mg | ORAL_TABLET | Freq: Every day | ORAL | 5 refills | Status: DC

## 2017-07-08 MED ORDER — CARVEDILOL 25 MG PO TABS: 25.0000 mg | ORAL_TABLET | Freq: Two times a day (BID) | ORAL | 5 refills | Status: DC

## 2017-07-08 MED ORDER — SACUBITRIL-VALSARTAN 97-103 MG PO TABS: 1.0000 | ORAL_TABLET | Freq: Two times a day (BID) | ORAL | 5 refills | Status: DC

## 2017-07-08 MED ORDER — FUROSEMIDE 40 MG PO TABS: 40.0000 mg | ORAL_TABLET | Freq: Every day | ORAL | 5 refills | Status: DC

## 2017-07-08 MED ORDER — HYDRALAZINE HCL 50 MG PO TABS: 75.0000 mg | ORAL_TABLET | Freq: Three times a day (TID) | ORAL | 5 refills | Status: DC

## 2017-07-08 MED ORDER — SPIRONOLACTONE 25 MG PO TABS: 25.0000 mg | ORAL_TABLET | Freq: Every day | ORAL | 5 refills | Status: DC

## 2017-07-09 ENCOUNTER — Ambulatory Visit
Admission: RE | Admit: 2017-07-09 | Discharge: 2017-07-09 | Disposition: A | Discharge status: Home / Self Care | Payer: BC Managed Care – PPO | Source: Ambulatory Visit | Attending: Physician Assistant | Admitting: Physician Assistant

## 2017-07-09 DIAGNOSIS — M25512 Pain in left shoulder: Principal | ICD-10-CM

## 2017-07-09 DIAGNOSIS — G8929 Other chronic pain: Secondary | ICD-10-CM

## 2017-07-10 NOTE — Telephone Encounter (Signed)
BiPAP titration done in November called patient to get update. LM for patient to call back.

## 2017-07-10 NOTE — Progress Notes (Signed)
Can you have him come see [place] this afternoon or tomorrow am?

## 2017-07-11 ENCOUNTER — Ambulatory Visit (INDEPENDENT_AMBULATORY_CARE_PROVIDER_SITE_OTHER): Payer: BC Managed Care – PPO | Admitting: Orthopaedic Surgery

## 2017-07-11 ENCOUNTER — Encounter (INDEPENDENT_AMBULATORY_CARE_PROVIDER_SITE_OTHER): Payer: Self-pay | Admitting: Orthopaedic Surgery

## 2017-07-11 DIAGNOSIS — M25512 Pain in left shoulder: Secondary | ICD-10-CM

## 2017-07-11 DIAGNOSIS — G8929 Other chronic pain: Secondary | ICD-10-CM

## 2017-07-11 NOTE — Progress Notes (Signed)
Office Visit Note   Patient: Antonio Morgan           Date of Birth: June 06, 1980           MRN: [id number] Visit Date: 07/11/2017              Requested by: Kari Baars, MD [address] [place], [place] [address] PCP: Kari Baars, MD   Assessment & Plan: Visit Diagnoses:  1. Chronic left shoulder pain     Plan: MRI findings are consistent with glenohumeral synovitis and AC joint arthropathy.  There is tendinosis of his rotator cuff without any retracted full-thickness tears.  He also has humeral head edema that is nonspecific and possibly consistent with internal impingement.  At this point I would like to get him set up for intra-articular steroid injection.  I recommend that he avoid use of the left arm for exercising until this is feeling completely better.  I would recommend waiting at least 6 weeks after the steroid injection.  Questions encouraged and answered.  Follow-up as needed.  Follow-Up Instructions: Return if symptoms worsen or fail to improve.   Orders:  No orders of the defined types were placed in this encounter.  No orders of the defined types were placed in this encounter.     Procedures: No procedures performed   Clinical Data: No additional findings.   Subjective: Chief Complaint  Patient presents with  . Left Shoulder - Pain, Follow-up    Patient follows up for his left shoulder MRI.  He is actually feeling a little bit better.  Denies any numbness and tingling.   Review of Systems  Constitutional: Negative.   All other systems reviewed and are negative.    Objective: Vital Signs: There were no vitals taken for this visit.  Physical Exam  Constitutional: He is oriented to person, place, and time. He appears well-developed and well-nourished.  Pulmonary/Chest: Effort normal.  Abdominal: Soft.  Neurological: He is alert and oriented to person, place, and time.  Skin: Skin is warm.  Psychiatric: He has a normal  mood and affect. His behavior is normal. Judgment and thought content normal.  Nursing note and vitals reviewed.   Ortho Exam Left shoulder exam is stable.  Nonfocal.   Specialty Comments:  No specialty comments available.  Imaging: No results found.   PMFS History: Patient Active Problem List   Diagnosis Date Noted  . Acute pain of right knee 04/29/2017  . Rotator cuff tendinitis, left 04/29/2017  . Chronic respiratory failure with hypoxia (HCC) 12/29/2015  . Acute congestive heart failure (HCC) 12/09/2015  . CHF (congestive heart failure) (HCC) 12/09/2015  . DOE (dyspnea on exertion) 01/28/2014  . Morbid obesity (HCC) 01/28/2014  . Essential hypertension, malignant 01/28/2014  . Elevated brain natriuretic peptide (BNP) level 01/28/2014  . Gout 01/28/2014   Past Medical History:  Diagnosis Date  . Gout   . Hypertension   . Obese     Family History  Problem Relation Age of Onset  . Heart disease Father        heart transplant  . Hypertension Mother     Past Surgical History:  Procedure Laterality Date  . CARDIAC CATHETERIZATION N/A 12/13/2015   Procedure: Right/Left Heart Cath and Coronary Angiography;  Surgeon: Dolores Patty, MD;  Location: [facility] INVASIVE CV LAB;  Service: Cardiovascular;  Laterality: N/A;   Social History   Occupational History  . Occupation: guitarist  Tobacco Use  . Smoking status:  Former Smoker    Packs/day: 1.00    Years: 10.00    Pack years: 10.00    Last attempt to quit: 04/23/2012    Years since quitting: 5.2  . Smokeless tobacco: Never Used  Substance and Sexual Activity  . Alcohol use: No  . Drug use: No  . Sexual activity: Not on file

## 2017-07-16 ENCOUNTER — Encounter (INDEPENDENT_AMBULATORY_CARE_PROVIDER_SITE_OTHER): Payer: Self-pay | Admitting: Orthopaedic Surgery

## 2017-07-16 ENCOUNTER — Ambulatory Visit (INDEPENDENT_AMBULATORY_CARE_PROVIDER_SITE_OTHER): Payer: BC Managed Care – PPO

## 2017-07-16 ENCOUNTER — Ambulatory Visit (INDEPENDENT_AMBULATORY_CARE_PROVIDER_SITE_OTHER): Payer: BC Managed Care – PPO | Admitting: Orthopaedic Surgery

## 2017-07-16 DIAGNOSIS — M1712 Unilateral primary osteoarthritis, left knee: Secondary | ICD-10-CM | POA: Diagnosis not present

## 2017-07-16 MED ORDER — LIDOCAINE HCL 1 % IJ SOLN
2.0000 mL | INTRAMUSCULAR | Status: AC | PRN
  Administered 2017-07-16: 2 mL

## 2017-07-16 MED ORDER — BUPIVACAINE HCL 0.5 % IJ SOLN
2.0000 mL | INTRAMUSCULAR | Status: AC | PRN
  Administered 2017-07-16: 2 mL via INTRA_ARTICULAR

## 2017-07-16 MED ORDER — METHYLPREDNISOLONE ACETATE 40 MG/ML IJ SUSP
40.0000 mg | INTRAMUSCULAR | Status: AC | PRN
  Administered 2017-07-16: 40 mg via INTRA_ARTICULAR

## 2017-07-16 NOTE — Progress Notes (Signed)
Office Visit Note   Patient: Antonio Morgan           Date of Birth: 09-Nov-1980           MRN: [id number] Visit Date: 07/16/2017              Requested by: Kari Baars, MD [address] [place], [place] [address] PCP: Kari Baars, MD   Assessment & Plan: Visit Diagnoses:  1. Primary osteoarthritis of left knee     Plan: Impression is 37 year old gentleman with left knee tricompartment degenerative joint disease and reactive effusion.  This was aspirated and the cortisone was injected.  Patient tolerates well.  We will see him back as needed.  Follow-Up Instructions: Return if symptoms worsen or fail to improve.   Orders:  Orders Placed This Encounter  Procedures  . XR KNEE 3 VIEW LEFT   No orders of the defined types were placed in this encounter.     Procedures: Large Joint Inj: L knee on 07/16/2017 8:38 AM Details: 22 G needle Medications: 2 mL bupivacaine 0.5 %; 2 mL lidocaine 1 %; 40 mg methylPREDNISolone acetate 40 MG/ML Aspirate: 80 mL yellow and cloudy; sent for lab analysis Outcome: tolerated well, no immediate complications Patient was prepped and draped in the usual sterile fashion.       Clinical Data: No additional findings.   Subjective: Chief Complaint  Patient presents with  . Left Knee - Pain     Antonio Morgan is a 37 year old gentleman comes in with a new problem of left knee pain.  He previously saw Mardella Layman for right knee arthritis which significantly improved after cortisone injection.  He denies any injuries to the left knee.  Denies any constitutional symptoms.  Denies any numbness and tingling.   Review of Systems  Constitutional: Negative.   All other systems reviewed and are negative.    Objective: Vital Signs: There were no vitals taken for this visit.  Physical Exam  Constitutional: He is oriented to person, place, and time. He appears well-developed and well-nourished.  Pulmonary/Chest: Effort normal.    Abdominal: Soft.  Neurological: He is alert and oriented to person, place, and time.  Skin: Skin is warm.  Psychiatric: He has a normal mood and affect. His behavior is normal. Judgment and thought content normal.  Nursing note and vitals reviewed.   Ortho Exam   Left knee exam shows a large joint effusion.  Collaterals and cruciates are stable.  No signs of infection. Specialty Comments:  No specialty comments available.  Imaging: Xr Knee 3 View Left  Result Date: 07/16/2017 Tricompartmental degenerative joint disease with joint space narrowing and periarticular spurring    PMFS History: Patient Active Problem List   Diagnosis Date Noted  . Acute pain of right knee 04/29/2017  . Rotator cuff tendinitis, left 04/29/2017  . Chronic respiratory failure with hypoxia (HCC) 12/29/2015  . Acute congestive heart failure (HCC) 12/09/2015  . CHF (congestive heart failure) (HCC) 12/09/2015  . DOE (dyspnea on exertion) 01/28/2014  . Morbid obesity (HCC) 01/28/2014  . Essential hypertension, malignant 01/28/2014  . Elevated brain natriuretic peptide (BNP) level 01/28/2014  . Gout 01/28/2014   Past Medical History:  Diagnosis Date  . Gout   . Hypertension   . Obese     Family History  Problem Relation Age of Onset  . Heart disease Father        heart transplant  . Hypertension Mother     Past Surgical  History:  Procedure Laterality Date  . CARDIAC CATHETERIZATION N/A 12/13/2015   Procedure: Right/Left Heart Cath and Coronary Angiography;  Surgeon: Dolores Patty, MD;  Location: [facility] INVASIVE CV LAB;  Service: Cardiovascular;  Laterality: N/A;   Social History   Occupational History  . Occupation: guitarist  Tobacco Use  . Smoking status: Former Smoker    Packs/day: 1.00    Years: 10.00    Pack years: 10.00    Last attempt to quit: 04/23/2012    Years since quitting: 5.2  . Smokeless tobacco: Never Used  Substance and Sexual Activity  . Alcohol use: No  . Drug use:  No  . Sexual activity: Not on file

## 2017-07-16 NOTE — Addendum Note (Signed)
Addended by: Albertina Parr on: 07/16/2017 09:31 AM   Modules accepted: Orders

## 2017-07-17 ENCOUNTER — Telehealth (HOSPITAL_COMMUNITY): Payer: Self-pay | Admitting: Pharmacist

## 2017-07-17 LAB — SYNOVIAL CELL COUNT + DIFF, W/ CRYSTALS
Basophils, %: 0 %
Eosinophils-Synovial: 0 % (ref 0–2)
LYMPHOCYTES-SYNOVIAL FLD: 4 % (ref 0–74)
Monocyte/Macrophage: 6 % (ref 0–69)
NEUTROPHIL, SYNOVIAL: 90 % — AB (ref 0–24)
Synoviocytes, %: 0 % (ref 0–15)
WBC, SYNOVIAL: 18070 {cells}/uL — AB (ref <150)

## 2017-07-17 LAB — TIQ-NTM

## 2017-07-17 NOTE — Telephone Encounter (Signed)
Mr. Aswad called with a gout flare up in his knee. He saw an orthopaedic MD recently who was able to give him a steroid injection in his knee. He states that he stopped taking allopurinol because his PCP told him it was causing his gout. I have advised him that he should not start allopurinol during an active flare up but once the flare up was resolved, he should restart the allopurinol to prevent further recurrences. I have also advised him to take his PRN colchicine to treat this current recurrence. He was grateful for the advice and will call back with any further issues.   [name]. Bonnye Fava, PharmD, BCPS, CPP Clinical Pharmacist Phone: [phone] 07/17/2017 9:48 AM

## 2017-07-17 NOTE — Progress Notes (Signed)
Please let him know that the fluid showed gout.

## 2017-07-18 ENCOUNTER — Telehealth (INDEPENDENT_AMBULATORY_CARE_PROVIDER_SITE_OTHER): Payer: Self-pay

## 2017-07-18 NOTE — Telephone Encounter (Signed)
Called patient regarding Fluid results. No answer LMOM.

## 2017-07-22 LAB — ANAEROBIC AND AEROBIC CULTURE
AER RESULT: NO GROWTH
MICRO NUMBER: 90376202
MICRO NUMBER:: 90376203
SPECIMEN QUALITY:: ADEQUATE
SPECIMEN QUALITY:: ADEQUATE

## 2017-07-26 ENCOUNTER — Encounter: Payer: Self-pay | Admitting: *Deleted

## 2017-07-26 NOTE — Telephone Encounter (Signed)
Spoke with [facility] to clarify if patient was compliant with CPAP because patient has never had his 10 week compliance with Dr Mayford Knife. They informed  Me that patient has had his BiPAP since august 2018 and his account was recently switched to self pay. Their records indicate patient has been non compliant with use of his BiPAP.I have attempted several times to contact patient and will mail a compliance letter.

## 2017-08-30 ENCOUNTER — Telehealth (HOSPITAL_COMMUNITY): Payer: Self-pay | Admitting: Pharmacist

## 2017-08-30 NOTE — Telephone Encounter (Signed)
Mr. Stifel called stating that he has only had minimal improvement in his ED symptoms since increasing his Viagra to the maximum dose 100 mg PRN and wanted a recommendation on what to do next. Per discussion with Dr. Gala Romney, will refer to urology for further recommendations.   [name]. Bonnye Fava, PharmD, BCPS, CPP Clinical Pharmacist Phone: [phone] 08/30/2017 10:14 AM

## 2017-09-05 ENCOUNTER — Telehealth (HOSPITAL_COMMUNITY): Payer: Self-pay | Admitting: *Deleted
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
## 2017-09-06 NOTE — Telephone Encounter (Signed)
Pt sch to see Dr Ronne Binning at [facility] on 6/28

## 2017-10-10 ENCOUNTER — Ambulatory Visit (HOSPITAL_COMMUNITY): Admission: RE | Admit: 2017-10-10 | Payer: BC Managed Care – PPO | Source: Ambulatory Visit

## 2017-10-10 ENCOUNTER — Encounter (HOSPITAL_COMMUNITY): Payer: BC Managed Care – PPO | Admitting: Internal Medicine

## 2018-01-01 ENCOUNTER — Encounter (HOSPITAL_COMMUNITY): Payer: Self-pay | Admitting: Internal Medicine

## 2018-01-01 ENCOUNTER — Ambulatory Visit (HOSPITAL_COMMUNITY)
Admission: RE | Admit: 2018-01-01 | Discharge: 2018-01-01 | Disposition: A | Discharge status: Home / Self Care | Payer: BC Managed Care – PPO | Source: Ambulatory Visit | Attending: Internal Medicine | Admitting: Internal Medicine

## 2018-01-01 ENCOUNTER — Ambulatory Visit (HOSPITAL_BASED_OUTPATIENT_CLINIC_OR_DEPARTMENT_OTHER)
Admission: RE | Admit: 2018-01-01 | Discharge: 2018-01-01 | Disposition: A | Discharge status: Home / Self Care | Payer: BC Managed Care – PPO | Source: Ambulatory Visit | Attending: Internal Medicine | Admitting: Internal Medicine

## 2018-01-01 ENCOUNTER — Other Ambulatory Visit: Payer: Self-pay

## 2018-01-01 VITALS — BP 133/86 | HR 62 | Wt 281.2 lb

## 2018-01-01 DIAGNOSIS — I38 Endocarditis, valve unspecified: Secondary | ICD-10-CM

## 2018-01-01 DIAGNOSIS — Z9981 Dependence on supplemental oxygen: Secondary | ICD-10-CM | POA: Insufficient documentation

## 2018-01-01 DIAGNOSIS — Z79899 Other long term (current) drug therapy: Secondary | ICD-10-CM | POA: Insufficient documentation

## 2018-01-01 DIAGNOSIS — Z7952 Long term (current) use of systemic steroids: Secondary | ICD-10-CM | POA: Insufficient documentation

## 2018-01-01 DIAGNOSIS — Z791 Long term (current) use of non-steroidal anti-inflammatories (NSAID): Secondary | ICD-10-CM | POA: Diagnosis not present

## 2018-01-01 DIAGNOSIS — Z87891 Personal history of nicotine dependence: Secondary | ICD-10-CM | POA: Diagnosis not present

## 2018-01-01 DIAGNOSIS — I10 Essential (primary) hypertension: Secondary | ICD-10-CM | POA: Diagnosis not present

## 2018-01-01 DIAGNOSIS — Z6836 Body mass index (BMI) 36.0-36.9, adult: Secondary | ICD-10-CM | POA: Insufficient documentation

## 2018-01-01 DIAGNOSIS — Z8249 Family history of ischemic heart disease and other diseases of the circulatory system: Secondary | ICD-10-CM | POA: Diagnosis not present

## 2018-01-01 DIAGNOSIS — I5022 Chronic systolic (congestive) heart failure: Secondary | ICD-10-CM

## 2018-01-01 DIAGNOSIS — I110 Hypertensive heart disease with heart failure: Secondary | ICD-10-CM | POA: Insufficient documentation

## 2018-01-01 DIAGNOSIS — I428 Other cardiomyopathies: Secondary | ICD-10-CM | POA: Diagnosis not present

## 2018-01-01 DIAGNOSIS — G4733 Obstructive sleep apnea (adult) (pediatric): Secondary | ICD-10-CM | POA: Diagnosis not present

## 2018-01-01 DIAGNOSIS — M109 Gout, unspecified: Secondary | ICD-10-CM | POA: Insufficient documentation

## 2018-01-01 NOTE — Patient Instructions (Signed)
We will contact you in 4 months to schedule your next appointment.  

## 2018-01-01 NOTE — Progress Notes (Signed)
  Echocardiogram 2D Echocardiogram has been performed.  Antonio Morgan 01/01/2018, 2:44 PM

## 2018-01-01 NOTE — Progress Notes (Signed)
Advanced Heart Failure Clinic Note  PCP: Dr Juanetta Gosling  Primary Cardiologist:Dr Leslie Jester   HPI: Antonio Morgan is a 37 y.o. male with a past medical history of HTN and obesity diagnosed with acute systolic heart failure august 3893.   He presented to [facility] ED on 12/08/2015 for worsening dyspnea over the last year.  On admit , he was tachycardiac into the low-100's. Oxygen saturations were initially 85%. CXR on admit showed vascular congestion. CTA was negative for PE. ECHO was performed and showed severely reduced LVEF 10-15% and RV moderately/severely dilated. Diuresed with IV lasix and transitioned to lasix 40 mg twice daily. RHC/LHC 12/13/2015 with normal cors and well compensated hemodynamics. Also discharged on 2 liters oxygen continuously. Discharge weight was 352 pounds.   He presents today for regular follow up with Echo. Feeling good overall. He had positive sleep study in November, but they were never able to get the settings right. He has one at home but hasn't followed up in about 3 months. They have gone back and forth. He was able to tolerate at the study, but cannot tolerate the mask/machine at home. BP at home runs 110-120s. Denies dizziness or lightheadedness. Taking all medications. Working at full time. Works as an Programmer, applications at [employer]  Walking 2-5 miles a day.   Echo today shows EF 55%.   ECHO 12/09/2015 10-15% RV mod-severely dilated.  ECHO 03/23/2016 EF 35-40%   RHC/LHC 12/13/2015 RA = 6 RV = 44/8/10 PA = 43/17 (31) PCW = 13 Fick cardiac output/index = 7.6/2.8 PVR = 2.4 WU SVR = 855  Ao sat = 94%  PA sat = 67%, 70% Assessment: 1. Essentially normal coronaries 2. Well compensated hemodynamics with mild PAH   Review of systems complete and found to be negative unless listed in HPI.    SH:  Social History   Socioeconomic History  . Marital status: Single    Spouse name: Not on file  . Number of children: Not on file  . Years of  education: 76  . Highest education level: Not on file  Occupational History  . Occupation: guitarist  Social Needs  . Financial resource strain: Not on file  . Food insecurity:    Worry: Not on file    Inability: Not on file  . Transportation needs:    Medical: Not on file    Non-medical: Not on file  Tobacco Use  . Smoking status: Former Smoker    Packs/day: 1.00    Years: 10.00    Pack years: 10.00    Last attempt to quit: 04/23/2012    Years since quitting: 5.6  . Smokeless tobacco: Never Used  Substance and Sexual Activity  . Alcohol use: No  . Drug use: No  . Sexual activity: Not on file  Lifestyle  . Physical activity:    Days per week: Not on file    Minutes per session: Not on file  . Stress: Not on file  Relationships  . Social connections:    Talks on phone: Not on file    Gets together: Not on file    Attends religious service: Not on file    Active member of club or organization: Not on file    Attends meetings of clubs or organizations: Not on file    Relationship status: Not on file  . Intimate partner violence:    Fear of current or ex partner: Not on file    Emotionally abused: Not on file  Physically abused: Not on file    Forced sexual activity: Not on file  Other Topics Concern  . Not on file  Social History Narrative  . Not on file    FH:  Family History  Problem Relation Age of Onset  . Heart disease Father        heart transplant  . Hypertension Mother     Past Medical History:  Diagnosis Date  . Gout   . Hypertension   . Obese     Current Outpatient Medications  Medication Sig Dispense Refill  . acetaminophen (TYLENOL) 325 MG tablet Take 650 mg by mouth every 6 (six) hours as needed for mild pain.     Marland Kitchen allopurinol (ZYLOPRIM) 100 MG tablet Take 2 tablets (200 mg total) by mouth daily. (Patient not taking: Reported on 04/29/2017) 60 tablet 5  . carvedilol (COREG) 25 MG tablet Take 1 tablet (25 mg total) by mouth 2 (two) times  daily. 60 tablet 5  . colchicine 0.6 MG tablet TAKE 1 TABLET DAILY AS NEEDED FOR GOUT (Patient not taking: Reported on 04/29/2017) 30 tablet 0  . ENTRESTO 97-103 MG TAKE  (1)  TABLET TWICE A DAY. 60 tablet 0  . furosemide (LASIX) 40 MG tablet Take 1 tablet (40 mg total) by mouth daily. 30 tablet 5  . hydrALAZINE (APRESOLINE) 50 MG tablet Take 1.5 tablets (75 mg total) by mouth 3 (three) times daily. 135 tablet 5  . ibuprofen (ADVIL,MOTRIN) 600 MG tablet Take 1 tablet (600 mg total) by mouth every 6 (six) hours as needed. 30 tablet 0  . indomethacin (INDOCIN) 25 MG capsule Take 25 mg by mouth 2 (two) times daily with a meal.    . isosorbide mononitrate (IMDUR) 60 MG 24 hr tablet Take 2 tablets (120 mg total) by mouth daily. 60 tablet 5  . lidocaine (LIDODERM) 5 % Place 1 patch onto the skin daily. Remove & Discard patch within 12 hours or as directed by MD 30 patch 0  . oseltamivir (TAMIFLU) 75 MG capsule Take 1 capsule (75 mg total) by mouth every 12 (twelve) hours. 10 capsule 0  . predniSONE (DELTASONE) 10 MG tablet     . sacubitril-valsartan (ENTRESTO) 97-103 MG Take 1 tablet by mouth 2 (two) times daily. 60 tablet 5  . sildenafil (VIAGRA) 100 MG tablet Take 1 tablet (100 mg total) by mouth daily as needed for erectile dysfunction. DO NOT take on days that you take isosorbide mononitrate. 10 tablet 5  . spironolactone (ALDACTONE) 25 MG tablet Take 1 tablet (25 mg total) by mouth daily. 30 tablet 5   No current facility-administered medications for this encounter.     Vitals:   01/01/18 1455  BP: 133/86  Pulse: 62  SpO2: 100%  Weight: 127.6 kg (281 lb 4 oz)   Filed Weights   01/01/18 1455  Weight: 127.6 kg (281 lb 4 oz)    PHYSICAL EXAM: General: Well appearing. No resp difficulty. HEENT: Normal anicteric Neck: Supple. JVP 5-6. Carotids 2+ bilat; no bruits. No thyromegaly or nodule noted. Cor: PMI nondisplaced. RRR, No M/G/R noted Lungs: CTAB, normal effort. No wheeze Abdomen:  Obese  Soft, non-tender, non-distended, no HSM. No bruits or masses. +BS  Extremities: no cyanosis, clubbing, rash, edema Neuro: alert & oriented x 3, cranial nerves grossly intact. moves all 4 extremities w/o difficulty. Affect pleasant  ASSESSMENT & PLAN: 1. Chronic Systolic Heart Failure- NICM (likely HTN) - cors ok on 12/09/2015. ECHO 12/09/2015 EF 10-15%. ECHO 12/17.  EF 35-40%   - Echo today shows EF has normalized to ~55% - NYHA I-II symptoms - Volume status stable on exam.   - Continue lasix 40 mg daily and 25 mg spiro daily.  - Continue coreg 25 mg BID. - Continue hydralazine 75 mg TID - Continue imdur 120 mg daily - Continue entresto 97-103 BID - Reinforced fluid restriction to < 2 L daily, sodium restriction to less than 2000 mg daily, and the importance of daily weights.   2. HTN- - Meds as above.  3. OSA - Has had trouble with CPAP titration. Needs to follow up.  4. Morbid Obesity  - Body mass index is 36.6 kg/m.  - Encouraged portion control.  5. Gout:  - Continue colchicine + allopurinol   Doing very well. 4 months to recheck BP. If doing well, will graduate to [facility].   Graciella Freer, PA-C  2:58 PM   Patient seen and examined with the above-signed Advanced Practice Provider and/or Housestaff. I personally reviewed laboratory data, imaging studies and relevant notes. I independently examined the patient and formulated the important aspects of the plan. I have edited the note to reflect any of my changes or salient points. I have personally discussed the plan with the patient and/or family.  Doing very well. NYHA I. I reviewed echo personally today and EF has now normalized with control of BP. Follows BP at home and it has been very well controlled. Continue current therapy. Can likely graduate HF Clinic after next visit,if stable.   Arvilla Meres, MD  11:33 PM

## 2018-01-02 ENCOUNTER — Other Ambulatory Visit (HOSPITAL_COMMUNITY): Payer: Self-pay | Admitting: Cardiology

## 2018-01-23 ENCOUNTER — Telehealth (HOSPITAL_COMMUNITY): Payer: Self-pay | Admitting: *Deleted

## 2018-01-23 NOTE — Telephone Encounter (Signed)
Pt left vm requesting a return call about his medications.i called patient back no answer/left VM.

## 2018-01-24 ENCOUNTER — Other Ambulatory Visit (HOSPITAL_COMMUNITY): Payer: Self-pay

## 2018-01-24 MED ORDER — SILDENAFIL CITRATE 100 MG PO TABS: 100.0000 mg | ORAL_TABLET | Freq: Every day | ORAL | 5 refills | Status: DC | PRN

## 2018-01-31 ENCOUNTER — Other Ambulatory Visit (HOSPITAL_COMMUNITY): Payer: Self-pay | Admitting: Cardiology

## 2018-02-07 ENCOUNTER — Telehealth (HOSPITAL_COMMUNITY): Payer: Self-pay | Admitting: Pharmacist

## 2018-02-07 NOTE — Telephone Encounter (Signed)
Entresto 97-103 mg BID PA approved by [facility] through 02/05/19.   [name]. Bonnye Fava, PharmD, BCPS, CPP Clinical Pharmacist Phone: [phone] 02/07/2018 9:22 AM

## 2018-02-28 ENCOUNTER — Other Ambulatory Visit (HOSPITAL_COMMUNITY): Payer: Self-pay | Admitting: Cardiology

## 2018-03-14 ENCOUNTER — Encounter (INDEPENDENT_AMBULATORY_CARE_PROVIDER_SITE_OTHER): Payer: Self-pay | Admitting: Physician Assistant

## 2018-03-14 ENCOUNTER — Ambulatory Visit (INDEPENDENT_AMBULATORY_CARE_PROVIDER_SITE_OTHER): Payer: BC Managed Care – PPO | Admitting: Physician Assistant

## 2018-03-14 DIAGNOSIS — M1711 Unilateral primary osteoarthritis, right knee: Secondary | ICD-10-CM | POA: Diagnosis not present

## 2018-03-14 MED ORDER — BUPIVACAINE HCL 0.25 % IJ SOLN
2.0000 mL | INTRAMUSCULAR | Status: AC | PRN
  Administered 2018-03-14: 2 mL via INTRA_ARTICULAR

## 2018-03-14 MED ORDER — METHYLPREDNISOLONE ACETATE 40 MG/ML IJ SUSP
40.0000 mg | INTRAMUSCULAR | Status: AC | PRN
  Administered 2018-03-14: 40 mg via INTRA_ARTICULAR

## 2018-03-14 MED ORDER — LIDOCAINE HCL 1 % IJ SOLN
2.0000 mL | INTRAMUSCULAR | Status: AC | PRN
  Administered 2018-03-14: 2 mL

## 2018-03-14 NOTE — Progress Notes (Signed)
Office Visit Note   Patient: Antonio Morgan           Date of Birth: 1980/11/02           MRN: [id number] Visit Date: 03/14/2018              Requested by: Kari Baars, MD [address] [place], [place] [address] PCP: Kari Baars, MD   Assessment & Plan: Visit Diagnoses:  1. Unilateral primary osteoarthritis, right knee     Plan: Impression is right knee arthritis flareup versus gouty attack.  Today, we aspirated 75 cc serosanguineous fluid from the right knee.  We then injected his knee with cortisone.  We will send the fluid off for cell count, crystals and culture.  We will let give him the results over the phone.  Follow-up as needed.  Follow-Up Instructions: Return if symptoms worsen or fail to improve.   Orders:  Orders Placed This Encounter  Procedures  . Large Joint Inj: R knee  . Anaerobic and Aerobic Culture  . Cell count + diff,  w/ cryst-synvl fld   No orders of the defined types were placed in this encounter.     Procedures: Large Joint Inj: R knee on 03/14/2018 2:48 PM Indications: pain Details: 22 G needle, anterolateral approach Medications: 2 mL lidocaine 1 %; 2 mL bupivacaine 0.25 %; 40 mg methylPREDNISolone acetate 40 MG/ML      Clinical Data: No additional findings.   Subjective: Chief Complaint  Patient presents with  . Right Knee - Pain    HPI patient is a pleasant 37 year old gentleman who presents to our clinic today with recurrent right knee pain.  History of primary localized osteoarthritis right knee with multiple gouty attacks to various joints.  We saw him back in January for the right knee were we aspirated and injected this with cortisone.  He had significant relief of symptoms until about a week ago.  He states that he banged his knee on a door which seems to have flared things up.  The pain he is having is to the entire knee.  He describes increased pain and tightness with ambulation as well as trying to flex the knee.   He has been taking colchicine with minimal relief of symptoms.  No fevers or chills.  Review of Systems as detailed in HPI.  All others reviewed and are negative   Objective: Vital Signs: There were no vitals taken for this visit.  Physical Exam well-developed well-nourished gentleman in no acute distress.  Alert and oriented x3.  Ortho Exam examination of the right knee reveals a 2+ effusion.  No warmth no erythema.  Range of motion 0 to 95 degrees.  No joint line tenderness.  He is neurovascular intact distally.  Specialty Comments:  No specialty comments available.  Imaging: No new imaging   PMFS History: Patient Active Problem List   Diagnosis Date Noted  . Unilateral primary osteoarthritis, right knee 03/14/2018  . Acute pain of right knee 04/29/2017  . Rotator cuff tendinitis, left 04/29/2017  . Chronic respiratory failure with hypoxia (HCC) 12/29/2015  . Acute congestive heart failure (HCC) 12/09/2015  . CHF (congestive heart failure) (HCC) 12/09/2015  . DOE (dyspnea on exertion) 01/28/2014  . Morbid obesity (HCC) 01/28/2014  . Essential hypertension, malignant 01/28/2014  . Elevated brain natriuretic peptide (BNP) level 01/28/2014  . Gout 01/28/2014   Past Medical History:  Diagnosis Date  . Gout   . Hypertension   . Obese  Family History  Problem Relation Age of Onset  . Heart disease Father        heart transplant  . Hypertension Mother     Past Surgical History:  Procedure Laterality Date  . CARDIAC CATHETERIZATION N/A 12/13/2015   Procedure: Right/Left Heart Cath and Coronary Angiography;  Surgeon: Dolores Patty, MD;  Location: [facility] INVASIVE CV LAB;  Service: Cardiovascular;  Laterality: N/A;   Social History   Occupational History  . Occupation: guitarist  Tobacco Use  . Smoking status: Former Smoker    Packs/day: 1.00    Years: 10.00    Pack years: 10.00    Last attempt to quit: 04/23/2012    Years since quitting: 5.8  . Smokeless  tobacco: Never Used  Substance and Sexual Activity  . Alcohol use: No  . Drug use: No  . Sexual activity: Not on file

## 2018-03-18 NOTE — Progress Notes (Signed)
Will you let him know that his lab results were positive for gout

## 2018-03-19 ENCOUNTER — Telehealth (INDEPENDENT_AMBULATORY_CARE_PROVIDER_SITE_OTHER): Payer: Self-pay

## 2018-03-19 NOTE — Telephone Encounter (Signed)
I advised on message below. States he is Doing much better.

## 2018-03-19 NOTE — Telephone Encounter (Signed)
Called patient no answer.

## 2018-03-19 NOTE — Telephone Encounter (Signed)
Called patient no answer. Could not leave voicemail. Will try again later just need to advise on message below.  "Will you let him know that his lab results were positive for gout "

## 2018-03-19 NOTE — Telephone Encounter (Signed)
Called patient no answer. Could not leave voicemail. Will try again later just need to advise on message below.  "Will you let him know that his lab results were positive for gout " 

## 2018-03-20 LAB — SYNOVIAL CELL COUNT + DIFF, W/ CRYSTALS
Basophils, %: 0 %
Eosinophils-Synovial: 0 % (ref 0–2)
LYMPHOCYTES-SYNOVIAL FLD: 9 % (ref 0–74)
Monocyte/Macrophage: 17 % (ref 0–69)
NEUTROPHIL, SYNOVIAL: 74 % — AB (ref 0–24)
Synoviocytes, %: 0 % (ref 0–15)
WBC, SYNOVIAL: 18630 {cells}/uL — AB (ref <150)

## 2018-03-20 LAB — ANAEROBIC AND AEROBIC CULTURE
AER RESULT: NO GROWTH
MICRO NUMBER: 91411494
MICRO NUMBER:: 91411495
SPECIMEN QUALITY: ADEQUATE
SPECIMEN QUALITY:: ADEQUATE

## 2018-03-23 ENCOUNTER — Other Ambulatory Visit (HOSPITAL_COMMUNITY): Payer: Self-pay | Admitting: Internal Medicine

## 2018-04-27 IMAGING — CT CT ANGIO CHEST
2 of 6 series · 18 of 36 positions shown · IV contrast (ISOVUE 370)
Comparison: None.

CLINICAL DATA: Shortness of breath for 1 year. Tachycardia.
Increased swelling in the legs. Difficulty breathing. Fever.

EXAM:
CT ANGIOGRAPHY CHEST WITH CONTRAST
TECHNIQUE: Multidetector CT imaging of the chest was performed using the
standard protocol during bolus administration of intravenous
contrast. Multiplanar CT image reconstructions and MIPs were
obtained to evaluate the vascular anatomy.
CONTRAST:  100 mL Isovue 370

[Series 6: thins for pacs · axial · 0.78mm/px · z∈[-172,+56]mm · 17 of 254 slices shown]
[im 13/254  lung]
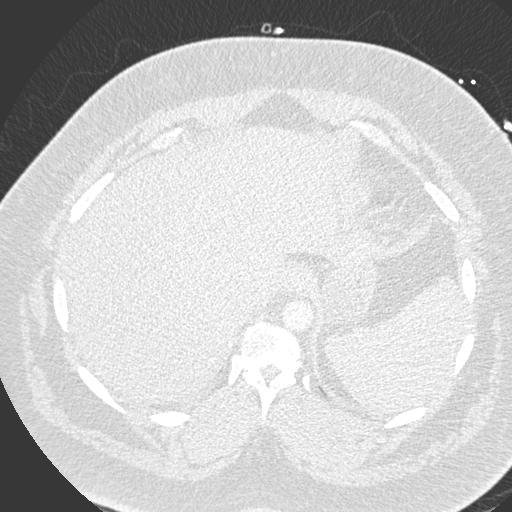
[im 26/254  mediastinal]
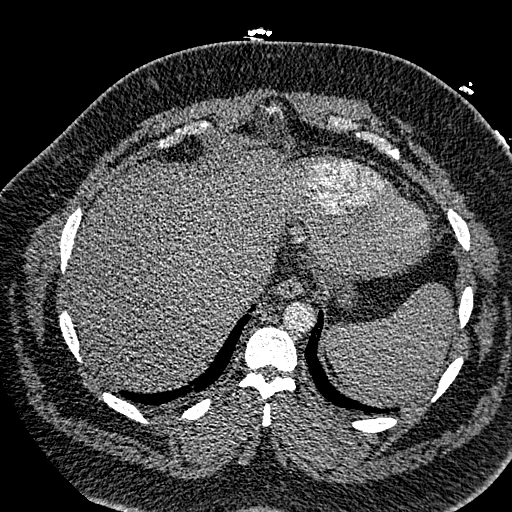
[im 38/254  lung]
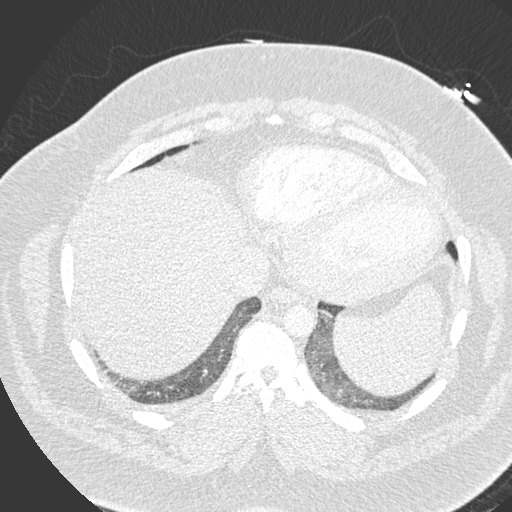
[im 51/254  mediastinal]
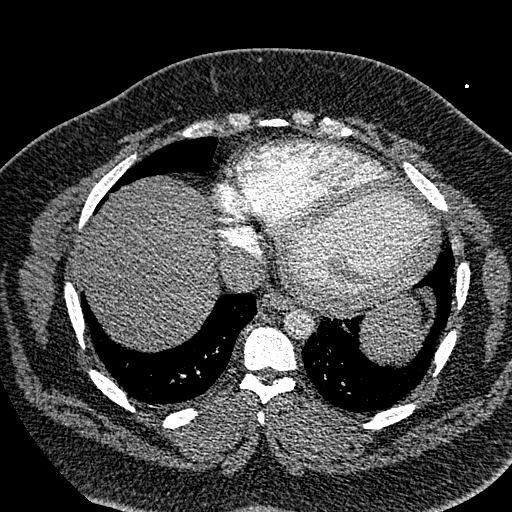
[im 76/254  lung]
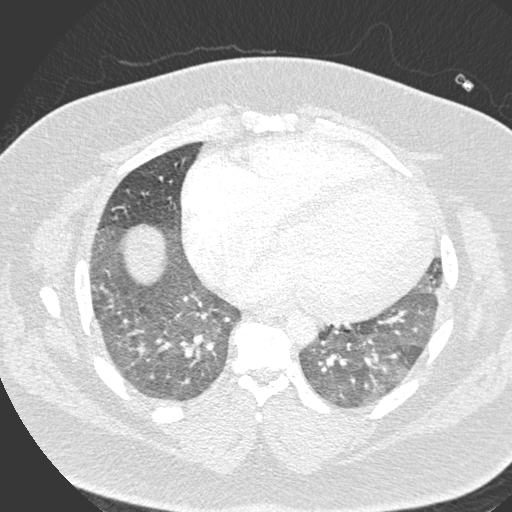
[im 89/254  mediastinal]
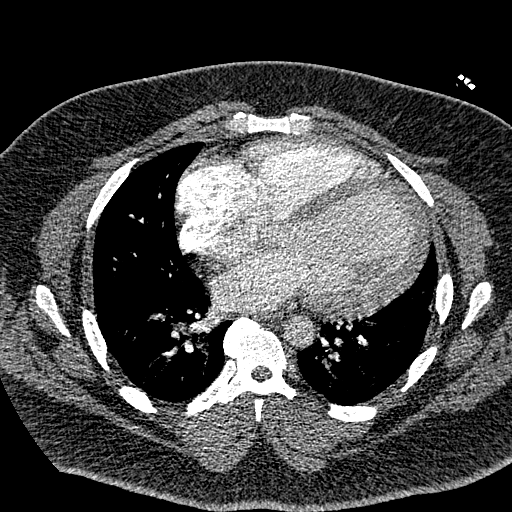
[im 102/254  lung]
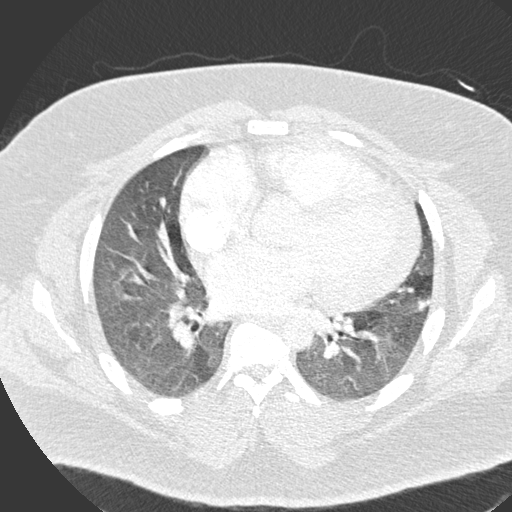
[im 114/254  mediastinal]
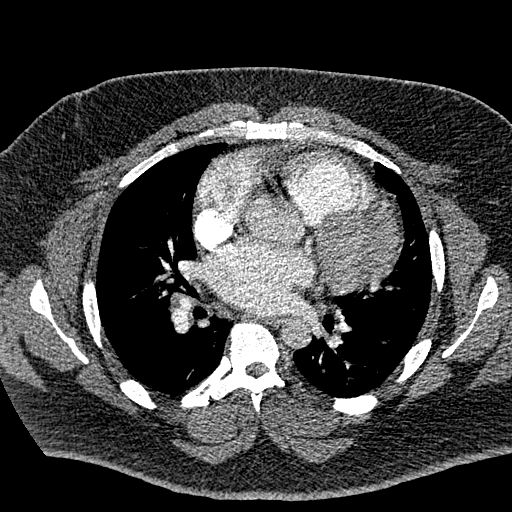
[im 127/254  lung]
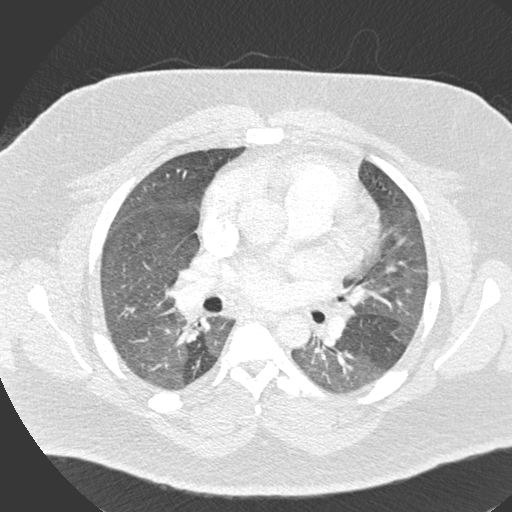
[im 140/254  mediastinal]
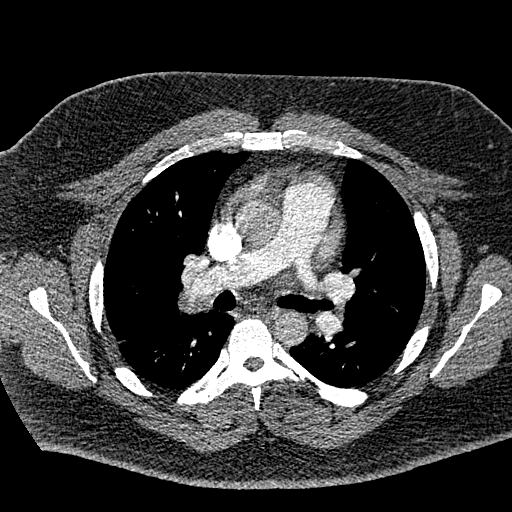
[im 152/254  lung]
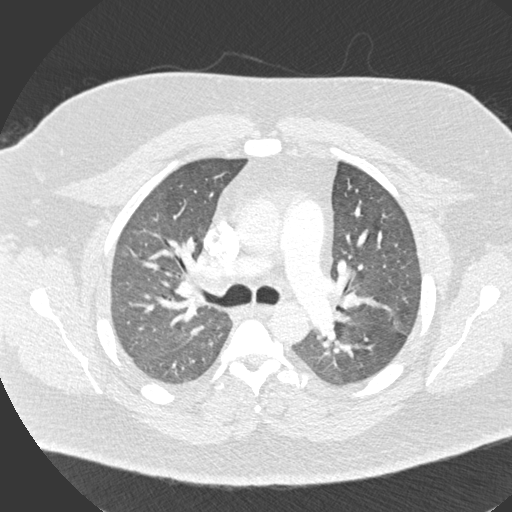
[im 165/254  mediastinal]
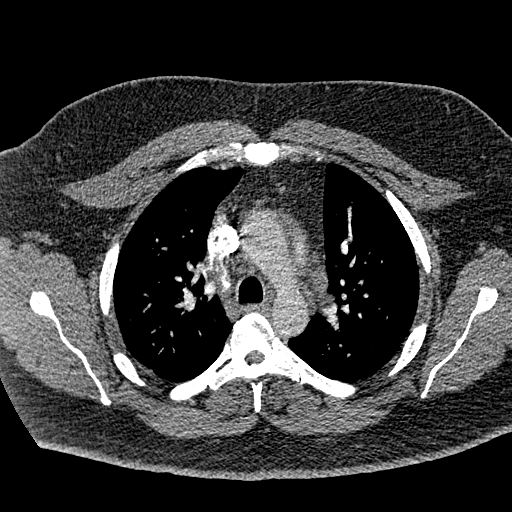
[im 178/254  lung]
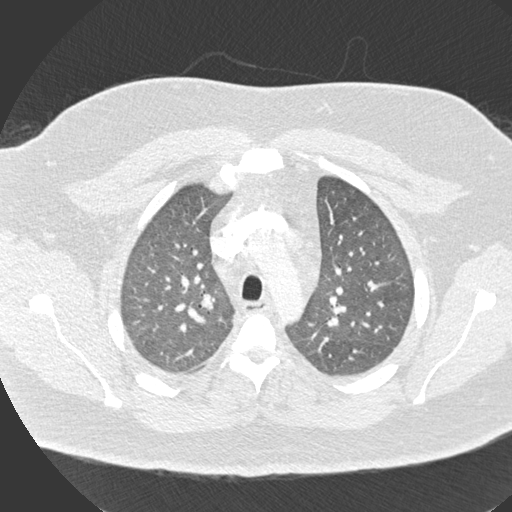
[im 203/254  mediastinal]
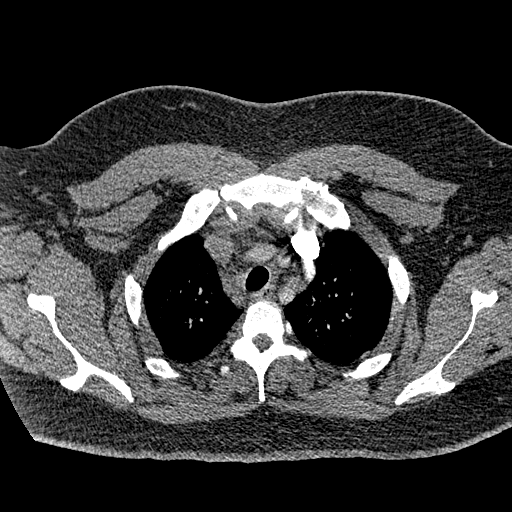
[im 216/254  lung]
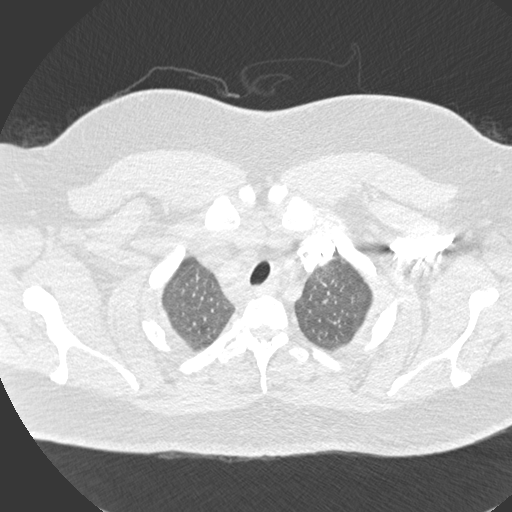
[im 228/254  mediastinal]
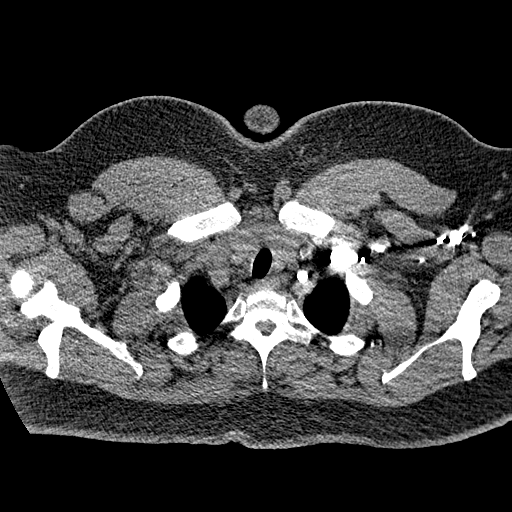
[im 241/254  lung]
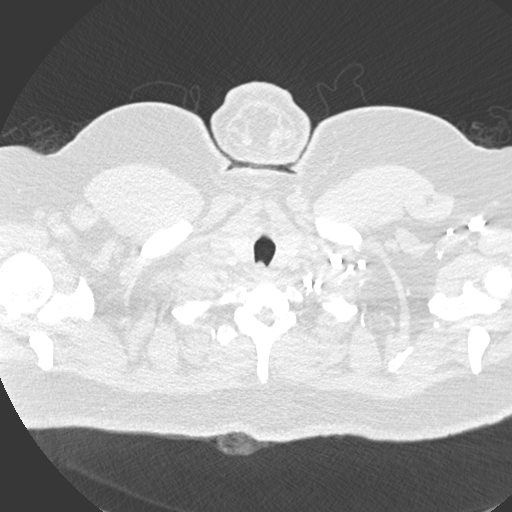

[Series 8: coronal mpr · coronal · 0.49mm/px · 1 of 155 slices shown]
[im 78/155  mediastinal]
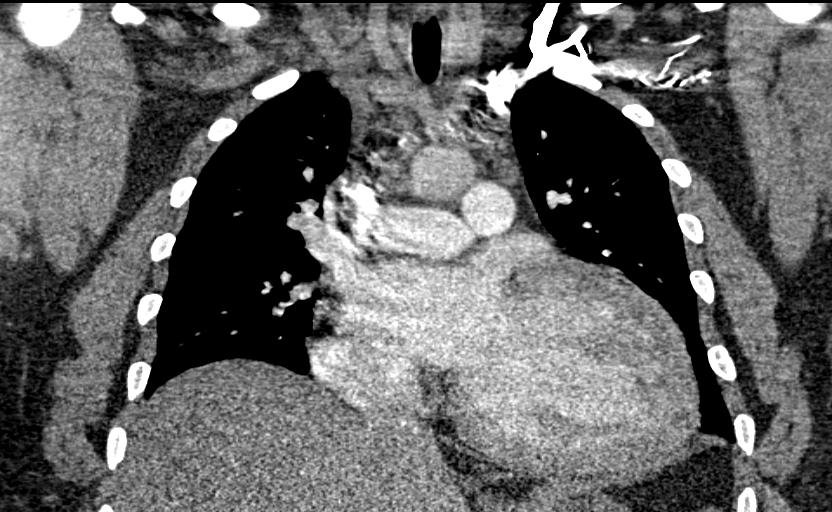

[18 of 36 positions shown; findings below may reference images not displayed]

FINDINGS: Technically adequate study with moderately good opacification of the
central and proximal segmental pulmonary arteries. More peripheral
pulmonary arteries are not well opacified and remain indeterminate.
Visualized pulmonary arteries demonstrate no significant central
pulmonary embolus.

Diffuse cardiac enlargement. Enlarged lymph nodes throughout the
mediastinum are nonspecific but probably reactive. Esophagus is
decompressed.

Evaluation of lungs is limited due to respiratory motion artifact.
There is a diffuse mosaic attenuation pattern to the lungs which may
represent motion artifact, air trapping, or edema. Airways are
patent. No pleural effusions. No pneumothorax.

Included portions of the upper abdominal organs are grossly
unremarkable. Degenerative changes in the thoracic spine.

Review of the MIP images confirms the above findings.
IMPRESSION: No large central pulmonary embolus demonstrated. Cardiac enlargement
with mosaic attenuation pattern in the lungs possibly due to edema,
air trapping, or motion artifact. Mild mediastinal lymphadenopathy
is probably reactive although nonspecific.

## 2018-07-03 ENCOUNTER — Other Ambulatory Visit (HOSPITAL_COMMUNITY): Payer: Self-pay | Admitting: Cardiology

## 2018-08-04 ENCOUNTER — Other Ambulatory Visit (HOSPITAL_COMMUNITY): Payer: Self-pay | Admitting: Internal Medicine

## 2018-08-25 ENCOUNTER — Telehealth (HOSPITAL_COMMUNITY): Payer: Self-pay | Admitting: *Deleted

## 2018-08-25 NOTE — Telephone Encounter (Signed)
You are pretty booked tomorrow. Can I add him on at 12:30?

## 2018-08-25 NOTE — Telephone Encounter (Signed)
Pt called to report he had a an episode of dizziness yesterday. Pt states he bent over to look at his shoe and got very dizzy when he stood back up that he fell to his knee. Patient states hes lost 130lbs and thinks maybe his medications may need to be adjusted. Patient is interested in having a virtual visit.   Message routed to Amy Clegg,NP for advice.

## 2018-08-25 NOTE — Telephone Encounter (Signed)
yes

## 2018-08-25 NOTE — Telephone Encounter (Signed)
Please set up for me tomorrow thks Fayth Trefry.

## 2018-08-26 ENCOUNTER — Encounter (HOSPITAL_COMMUNITY): Payer: Self-pay

## 2018-08-26 ENCOUNTER — Ambulatory Visit (HOSPITAL_COMMUNITY)
Admission: RE | Admit: 2018-08-26 | Discharge: 2018-08-26 | Disposition: A | Discharge status: Home / Self Care | Payer: BC Managed Care – PPO | Source: Ambulatory Visit | Attending: Internal Medicine | Admitting: Internal Medicine

## 2018-08-26 ENCOUNTER — Other Ambulatory Visit: Payer: Self-pay

## 2018-08-26 VITALS — BP 119/86 | HR 67 | Wt 255.0 lb

## 2018-08-26 DIAGNOSIS — I5022 Chronic systolic (congestive) heart failure: Secondary | ICD-10-CM | POA: Diagnosis not present

## 2018-08-26 DIAGNOSIS — I10 Essential (primary) hypertension: Secondary | ICD-10-CM

## 2018-08-26 DIAGNOSIS — I110 Hypertensive heart disease with heart failure: Secondary | ICD-10-CM | POA: Diagnosis not present

## 2018-08-26 DIAGNOSIS — G4733 Obstructive sleep apnea (adult) (pediatric): Secondary | ICD-10-CM | POA: Diagnosis not present

## 2018-08-26 MED ORDER — FUROSEMIDE 40 MG PO TABS: 40.0000 mg | ORAL_TABLET | ORAL | 2 refills | Status: DC | PRN

## 2018-08-26 NOTE — Progress Notes (Addendum)
LM for patient to call back to discuss AVS. AVS mailed

## 2018-08-26 NOTE — Addendum Note (Signed)
Encounter addended by: Marisa Hua, RN on: 08/26/2018 12:21 PM  Actions taken: Clinical Note Signed

## 2018-08-26 NOTE — Patient Instructions (Addendum)
CHANGE how you take Lasix.  STOP DAILY LASIX. Take Lasix 40mg  ONLY as needed for SOB or swelling.   Your physician recommends that you schedule a follow-up appointment in: 6 months with Nurse Practitioner.  You well be called to schedule this appointment.

## 2018-08-26 NOTE — Progress Notes (Signed)
Heart Failure TeleHealth Note  Due to national recommendations of social distancing due to COVID 19, Audio/video telehealth visit is felt to be most appropriate for this patient at this time.  See MyChart message from today for patient consent regarding telehealth for [facility].  Date:  08/26/2018   ID:  Antonio Morgan, DOB 02/03/1981, MRN [id number]  Location: Home  Provider location: [place] [facility] Type of Visit: Established patient   PCP:  Kari Baars, MD  Cardiologist:  No primary care provider on file. Primary HF: Dr Gala Romney   Chief Complaint: Heart Failure  History of Present Illness: Antonio Morgan is a 38 y.o. male with a history of  HTN and obesity diagnosed with acute systolic heart failure august 6568. LHC 2017 with normal coronaries.   EF was initially down to 10-15% but recovered to 55% 12/2017.   He presents via audio conferencing for a telehealth visit today due to dizziness. Overall feeling fine. He has been having dizziness when he stands up.  Denies SOB/PND/Orthopnea. Appetite ok. No fever or chills. Weight at home has gone down to 255 pounds. He has lost over 100 pounds.Marland KitchenHe is not using CPAP. Says he has not issues with snoring since he lost weight.  He is walking 4-5 miles a day. Taking all medications but has not had them this morning.   he denies symptoms worrisome for COVID 19.   Past Medical History:  Diagnosis Date  . Gout   . Hypertension   . Obese    Past Surgical History:  Procedure Laterality Date  . CARDIAC CATHETERIZATION N/A 12/13/2015   Procedure: Right/Left Heart Cath and Coronary Angiography;  Surgeon: Dolores Patty, MD;  Location: [facility] INVASIVE CV LAB;  Service: Cardiovascular;  Laterality: N/A;     Current Outpatient Medications  Medication Sig Dispense Refill  . acetaminophen (TYLENOL) 325 MG tablet Take 650 mg by mouth every 6 (six) hours as needed for mild pain.     . carvedilol (COREG) 25 MG  tablet TAKE  (1)  TABLET TWICE A DAY. 60 tablet 5  . ENTRESTO 97-103 MG TAKE  (1)  TABLET TWICE A DAY. 60 tablet 2  . furosemide (LASIX) 40 MG tablet TAKE 1 TABLET DAILY 30 tablet 2  . hydrALAZINE (APRESOLINE) 50 MG tablet TAKE 1 & 1/2 TABLET THREE TIMES DAILY 135 tablet 2  . ibuprofen (ADVIL,MOTRIN) 600 MG tablet Take 1 tablet (600 mg total) by mouth every 6 (six) hours as needed. 30 tablet 0  . isosorbide mononitrate (IMDUR) 60 MG 24 hr tablet TAKE 2 TABLETS DAILY 60 tablet 2  . sildenafil (VIAGRA) 100 MG tablet USE 1 TABLET BY MOUTH AS DIRECTED FOR ERECTIAL DYSFUNCTION 3 tablet 14  . spironolactone (ALDACTONE) 25 MG tablet TAKE 1 TABLET DAILY 30 tablet 2  . allopurinol (ZYLOPRIM) 100 MG tablet Take 2 tablets (200 mg total) by mouth daily. (Patient not taking: Reported on 08/26/2018) 60 tablet 5  . colchicine 0.6 MG tablet TAKE 1 TABLET DAILY AS NEEDED FOR GOUT (Patient not taking: Reported on 08/26/2018) 30 tablet 0  . indomethacin (INDOCIN) 25 MG capsule Take 25 mg by mouth 2 (two) times daily with a meal.    . lidocaine (LIDODERM) 5 % Place 1 patch onto the skin daily. Remove & Discard patch within 12 hours or as directed by MD (Patient not taking: Reported on 08/26/2018) 30 patch 0  . oseltamivir (TAMIFLU) 75 MG capsule Take 1 capsule (75 mg  total) by mouth every 12 (twelve) hours. 10 capsule 0   No current facility-administered medications for this encounter.     Allergies:   Hydrochlorothiazide and Lisinopril   Social History:  The patient  reports that he quit smoking about 6 years ago. He has a 10.00 pack-year smoking history. He has never used smokeless tobacco. He reports that he does not drink alcohol or use drugs.   Family History:  The patient's family history includes Heart disease in his father; Hypertension in his mother.   ROS:  Please see the history of present illness.   All other systems are personally reviewed and negative.   Exam:  Tele Health Call; Exam is subjective   General:  Speaks in full sentences. No resp difficulty. Lungs: Normal respiratory effort with conversation.  Abdomen: Non-distended per patient report Extremities: Pt denies edema. Neuro: Alert & oriented x 3.   Recent Labs: No results found for requested labs within last 8760 hours.  Personally reviewed   Wt Readings from Last 3 Encounters:  08/26/18 115.7 kg (255 lb)  01/01/18 127.6 kg (281 lb 4 oz)  04/29/17 (!) 145.2 kg (320 lb)    Vitals:   08/26/18 0911  BP: 119/86  Pulse: 67     ASSESSMENT AND PLAN:  1. Chronic Systolic HF- NICM likely due to HTN.  cors ok on 12/09/2015. ECHO 12/09/2015 EF 10-15%. ECHO 12/17. EF 35-40%   - ECHO 12/2017 EF~55% --NYHA I. Stop lasix I suspect he is dizzy because he is dry. Change lasix to as needed.  For now continue spironolactone but if he has more dizziness we will stop spironolactone.   - Continue coreg 25 mg BID. - Continue hydralazine 75 mg TID - Continue imdur 120 mg daily - Continue entresto 97-103 BID 2. HTN- Controlled  3. OSA He has not been using CPAP. He rarely uses  4. Morbid Obesity  Body mass index is 33.19 kg/m.  He continues to lose weight.  5. Gout:   COVID screen The patient does not have any symptoms that suggest any further testing/ screening at this time.  Social distancing reinforced today.  Patient Risk: After full review of this patients clinical status, I feel that they are at moderate risk for cardiac decompensation at this time.  Relevant cardiac medications were reviewed at length with the patient today. The patient does not have concerns regarding their medications at this time.   The following changes were made today:  Stop lasix and change to as needed.  Recommended follow-up: Follow up in 6 months APP   Today, I have spent 15 minutes with the patient with telehealth technology discussing the above issues .    [name], NP  08/26/2018 9:18 AM  [facility] [address] Heart and Vascular [place] [address] [phone] (office) [phone] (fax)

## 2018-08-26 NOTE — Addendum Note (Signed)
Encounter addended by: Marisa Hua, RN on: 08/26/2018 9:44 AM  Actions taken: Pharmacy for encounter modified, Order list changed, Clinical Note Signed

## 2018-08-26 NOTE — Addendum Note (Signed)
Encounter addended by: Marisa Hua, RN on: 08/26/2018 9:45 AM  Actions taken: Clinical Note Signed

## 2018-08-29 ENCOUNTER — Other Ambulatory Visit (HOSPITAL_COMMUNITY): Payer: Self-pay | Admitting: Internal Medicine

## 2018-10-01 ENCOUNTER — Other Ambulatory Visit (HOSPITAL_COMMUNITY): Payer: Self-pay | Admitting: Internal Medicine

## 2018-10-01 ENCOUNTER — Other Ambulatory Visit (HOSPITAL_COMMUNITY): Payer: Self-pay | Admitting: Cardiology

## 2018-11-06 ENCOUNTER — Other Ambulatory Visit (HOSPITAL_COMMUNITY): Payer: Self-pay | Admitting: Cardiology

## 2018-11-11 ENCOUNTER — Encounter (HOSPITAL_COMMUNITY): Payer: Self-pay

## 2018-11-11 ENCOUNTER — Ambulatory Visit (HOSPITAL_COMMUNITY)
Admission: RE | Admit: 2018-11-11 | Discharge: 2018-11-11 | Disposition: A | Discharge status: Home / Self Care | Payer: BC Managed Care – PPO | Source: Ambulatory Visit | Attending: Cardiology | Admitting: Cardiology

## 2018-11-11 ENCOUNTER — Other Ambulatory Visit: Payer: Self-pay

## 2018-11-11 VITALS — Wt 235.2 lb

## 2018-11-11 DIAGNOSIS — Z6830 Body mass index (BMI) 30.0-30.9, adult: Secondary | ICD-10-CM | POA: Diagnosis not present

## 2018-11-11 DIAGNOSIS — I110 Hypertensive heart disease with heart failure: Secondary | ICD-10-CM | POA: Diagnosis not present

## 2018-11-11 DIAGNOSIS — Z79899 Other long term (current) drug therapy: Secondary | ICD-10-CM | POA: Insufficient documentation

## 2018-11-11 DIAGNOSIS — M109 Gout, unspecified: Secondary | ICD-10-CM | POA: Diagnosis not present

## 2018-11-11 DIAGNOSIS — G4733 Obstructive sleep apnea (adult) (pediatric): Secondary | ICD-10-CM | POA: Insufficient documentation

## 2018-11-11 DIAGNOSIS — I10 Essential (primary) hypertension: Secondary | ICD-10-CM | POA: Diagnosis not present

## 2018-11-11 DIAGNOSIS — I428 Other cardiomyopathies: Secondary | ICD-10-CM | POA: Insufficient documentation

## 2018-11-11 DIAGNOSIS — R9431 Abnormal electrocardiogram [ECG] [EKG]: Secondary | ICD-10-CM | POA: Insufficient documentation

## 2018-11-11 DIAGNOSIS — Z87891 Personal history of nicotine dependence: Secondary | ICD-10-CM | POA: Diagnosis not present

## 2018-11-11 DIAGNOSIS — I509 Heart failure, unspecified: Secondary | ICD-10-CM

## 2018-11-11 DIAGNOSIS — R42 Dizziness and giddiness: Secondary | ICD-10-CM | POA: Diagnosis not present

## 2018-11-11 DIAGNOSIS — I5022 Chronic systolic (congestive) heart failure: Secondary | ICD-10-CM | POA: Insufficient documentation

## 2018-11-11 DIAGNOSIS — Z8249 Family history of ischemic heart disease and other diseases of the circulatory system: Secondary | ICD-10-CM | POA: Diagnosis not present

## 2018-11-11 LAB — BASIC METABOLIC PANEL
Anion gap: 10 (ref 5–15)
BUN: 16 mg/dL (ref 6–20)
CO2: 26 mmol/L (ref 22–32)
Calcium: 9.4 mg/dL (ref 8.9–10.3)
Chloride: 103 mmol/L (ref 98–111)
Creatinine, Ser: 1.33 mg/dL — ABNORMAL HIGH (ref 0.61–1.24)
GFR calc Af Amer: >60 mL/min (ref >60)
GFR calc non Af Amer: >60 mL/min (ref >60)
Glucose, Bld: 112 mg/dL — ABNORMAL HIGH (ref 70–99)
Potassium: 3.9 mmol/L (ref 3.5–5.1)
Sodium: 139 mmol/L (ref 135–145)

## 2018-11-11 LAB — CBC
HCT: 40.9 % (ref 39.0–52.0)
Hemoglobin: 14.2 g/dL (ref 13.0–17.0)
MCH: 29.3 pg (ref 26.0–34.0)
MCHC: 34.7 g/dL (ref 30.0–36.0)
MCV: 84.3 fL (ref 80.0–100.0)
Platelets: 229 10*3/uL (ref 150–400)
RBC: 4.85 MIL/uL (ref 4.22–5.81)
RDW: 14.1 % (ref 11.5–15.5)
WBC: 13.8 10*3/uL — ABNORMAL HIGH (ref 4.0–10.5)
nRBC: 0 % (ref 0.0–0.2)

## 2018-11-11 MED ORDER — LOSARTAN POTASSIUM 100 MG PO TABS: 100.0000 mg | ORAL_TABLET | Freq: Every evening | ORAL | 3 refills | Status: DC

## 2018-11-11 NOTE — Progress Notes (Signed)
Advanced Heart Failure Clinic Note  PCP: Dr Luan Pulling  Primary Cardiologist:Dr Bensimhon   HPI: Antonio Morgan is a 38 y.o. male with a past medical history of HTN and obesity diagnosed with acute systolic heart failure august 2017.   He presented to [facility] ED on 12/08/2015 for worsening dyspnea over the last year.  On admit , he was tachycardiac into the low-100's. Oxygen saturations were initially 85%. CXR on admit showed vascular congestion. CTA was negative for PE. ECHO was performed and showed severely reduced LVEF 10-15% and RV moderately/severely dilated. Diuresed with IV lasix and transitioned to lasix 40 mg twice daily. RHC/LHC 12/13/2015 with normal cors and well compensated hemodynamics. Also discharged on 2 liters oxygen continuously. Discharge weight was 352 pounds.   Today he return for HF follow up. Since the last visit lasix was changed to prn and spironolactone was stopped. Over the last year he has lost 140 pounds. Complaining of dizziness every time he stands up. Denies palpitations. Denies SOB/PND/Orthopnea. Appetite ok. No fever or chills. Weight at home 235 pounds. Walking over 11000 steps a day. Taking all medications. 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    Echo 12/2017 EF 55%.  ECHO 12/09/2015 10-15% RV mod-severely dilated.  ECHO 03/23/2016 EF 35-40%   RHC/LHC 12/13/2015 RA = 6 RV = 44/8/10 PA = 43/17 (31) PCW = 13 Fick cardiac output/index = 7.6/2.8 PVR = 2.4 WU SVR = 855  Ao sat = 94%  PA sat = 67%, 70% Assessment: 1. Essentially normal coronaries 2. Well compensated hemodynamics with mild PAH   Review of systems complete and found to be negative unless listed in HPI.    SH:  Social History   Socioeconomic History  . Marital status: Single    Spouse name: Not on file  . Number of children: Not on file  . Years of education: 44  . Highest education level: Not on file  Occupational History  . Occupation: guitarist  Social Needs  .  Financial resource strain: Not on file  . Food insecurity    Worry: Not on file    Inability: Not on file  . Transportation needs    Medical: Not on file    Non-medical: Not on file  Tobacco Use  . Smoking status: Former Smoker    Packs/day: 1.00    Years: 10.00    Pack years: 10.00    Quit date: 04/23/2012    Years since quitting: 6.5  . Smokeless tobacco: Never Used  Substance and Sexual Activity  . Alcohol use: No  . Drug use: No  . Sexual activity: Not on file  Lifestyle  . Physical activity    Days per week: Not on file    Minutes per session: Not on file  . Stress: Not on file  Relationships  . Social Herbalist on phone: Not on file    Gets together: Not on file    Attends religious service: Not on file    Active member of club or organization: Not on file    Attends meetings of clubs or organizations: Not on file    Relationship status: Not on file  . Intimate partner violence    Fear of current or ex partner: Not on file    Emotionally abused: Not on file    Physically abused: Not on file    Forced sexual activity: Not on file  Other Topics Concern  . Not on file  Social History Narrative  . Not on file    FH:  Family History  Problem Relation Age of Onset  . Heart disease Father        heart transplant  . Hypertension Mother     Past Medical History:  Diagnosis Date  . Gout   . Hypertension   . Obese     Current Outpatient Medications  Medication Sig Dispense Refill  . acetaminophen (TYLENOL) 325 MG tablet Take 650 mg by mouth every 6 (six) hours as needed for mild pain.     . carvedilol (COREG) 25 MG tablet TAKE (1) TABLET TWICE A DAY. 60 tablet 11  . colchicine 0.6 MG tablet TAKE 1 TABLET DAILY AS NEEDED FOR GOUT 30 tablet 0  . ENTRESTO 97-103 MG TAKE  (1)  TABLET TWICE A DAY. 60 tablet 11  . furosemide (LASIX) 40 MG tablet Take 40 mg by mouth as needed.    Marland Kitchen ibuprofen (ADVIL,MOTRIN) 600 MG tablet Take 1 tablet (600 mg total) by  mouth every 6 (six) hours as needed. 30 tablet 0  . isosorbide mononitrate (IMDUR) 60 MG 24 hr tablet TAKE 2 TABLETS DAILY 60 tablet 11  . lidocaine (LIDODERM) 5 % Place 1 patch onto the skin daily. Remove & Discard patch within 12 hours or as directed by MD 30 patch 0  . sildenafil (VIAGRA) 100 MG tablet USE 1 TABLET BY MOUTH AS DIRECTED FOR ERECTIAL DYSFUNCTION 3 tablet 14  . hydrALAZINE (APRESOLINE) 50 MG tablet TAKE 1 & 1/2 TABLET THREE TIMES DAILY (Patient not taking: Reported on 11/11/2018) 135 tablet 11   No current facility-administered medications for this encounter.    Sitting BP 126/68 Standing 114/60  Vitals:   11/11/18 0941 11/11/18 0946  SpO2: 100% 98%  Weight: 106.7 kg (235 lb 3.2 oz)    Filed Weights   11/11/18 0941  Weight: 106.7 kg (235 lb 3.2 oz)   Wt Readings from Last 3 Encounters:  11/11/18 106.7 kg (235 lb 3.2 oz)  08/26/18 115.7 kg (255 lb)  01/01/18 127.6 kg (281 lb 4 oz)   Reds 38%   PHYSICAL EXAM: General:  Well appearing. No resp difficulty HEENT: normal Neck: supple. no JVD. Carotids 2+ bilat; no bruits. No lymphadenopathy or thryomegaly appreciated. Cor: PMI nondisplaced. Regular rate & rhythm. No rubs, gallops or murmurs. Lungs: clear Abdomen: soft, nontender, nondistended. No hepatosplenomegaly. No bruits or masses. Good bowel sounds. Extremities: no cyanosis, clubbing, rash, edema Neuro: alert & orientedx3, cranial nerves grossly intact. moves all 4 extremities w/o difficulty. Affect pleasant  EKG: Sinus Rhythm 77 bpm with occasional PVC  ASSESSMENT & PLAN: 1. Chronic Systolic Heart Failure- NICM (likely HTN) - cors ok on 12/09/2015. ECHO 12/09/2015 EF 10-15%. ECHO 12/17. EF 35-40%   - EF recovered 2019 to 55%.  -NYHA I. Volume status stable. He does not need lasix or spironolactone.  - Continue coreg 25 mg BID. - Continue hydralazine 75 mg TID - Continue imdur 120 mg daily - Stop entresto due to ortho stasis. Tomorrow night he will start  losartan 100 mg daily.   -Check BMET  2. HTN - Meds as above.  - Follow up in 2 weeks to reassess BP.  3. OSA - Has had trouble with CPAP titration. Needs to follow up.  4. Morbid Obesity  - Body mass index is 30.61 kg/m.  - Encouraged portion control.  5. Gout:  - Continue colchicine + allopurinol  6. Dizziness Suspect ortho stasis from volume depletion.  EKG ok.  - Check CBC  Follow up 4 months with Dr Gala RomneyBensimhon an ECHO.  Tonye BecketAmy Myrth Dahan, NP  10:04 AM

## 2018-11-11 NOTE — Patient Instructions (Signed)
Lab work done today. We will notify you of any abnormal labs. No news is good news!  EKG done today.  STOP Entresto  START Losartan 100mg . One tab every evening at bedtime. Please start this tomorrow evening 11/12/2018.  Please follow up in 2 weeks with a blood pressure check.  Please follow up with Dr. Haroldine Laws in 4 months with an echocardiogram.  Your physician has requested that you have an echocardiogram. Echocardiography is a painless test that uses sound waves to create images of your heart. It provides your doctor with information about the size and shape of your heart and how well your heart's chambers and valves are working. This procedure takes approximately one hour. There are no restrictions for this procedure. This will be done at your follow up visit.  THE GATE CODE FOR November IS 7008.   At the [facility], you and your health needs are our priority. As part of our continuing mission to provide you with exceptional heart care, we have created designated Provider Care Teams. These Care Teams include your primary Cardiologist (physician) and Advanced Practice Providers (APPs- Physician Assistants and Nurse Practitioners) who all work together to provide you with the care you need, when you need it.   You may see any of the following providers on your designated Care Team at your next follow up: Marland Kitchen Dr Glori Bickers . Dr Loralie Champagne . Darrick Grinder, NP

## 2018-11-11 NOTE — Progress Notes (Signed)
Reds clip   Station: D   Ruler- 35  Reading- 38%

## 2018-11-25 ENCOUNTER — Ambulatory Visit (HOSPITAL_COMMUNITY)
Admission: RE | Admit: 2018-11-25 | Discharge: 2018-11-25 | Disposition: A | Discharge status: Home / Self Care | Payer: BC Managed Care – PPO | Source: Ambulatory Visit | Attending: Cardiology | Admitting: Cardiology

## 2018-11-25 ENCOUNTER — Other Ambulatory Visit: Payer: Self-pay

## 2018-11-25 VITALS — BP 122/68 | HR 79 | Wt 235.4 lb

## 2018-11-25 DIAGNOSIS — Z0130 Encounter for examination of blood pressure without abnormal findings: Secondary | ICD-10-CM | POA: Insufficient documentation

## 2018-11-25 DIAGNOSIS — I5022 Chronic systolic (congestive) heart failure: Secondary | ICD-10-CM

## 2018-11-25 NOTE — Progress Notes (Signed)
Patient seen for nurse visit blood pressure check. Last office visit patient Antonio Clegg,NP-c stopped entresto due to ortho stasis. Patient started losartan 100 mg daily.  Today patient stated dizziness has stopped and he "feels great".  Per Antonio no changes.  Pt aware and agreeable with plan.

## 2018-11-25 NOTE — Addendum Note (Signed)
Encounter addended by: Harvie Junior, CMA on: 11/25/2018 9:54 AM  Actions taken: Vitals modified

## 2019-01-27 ENCOUNTER — Emergency Department (HOSPITAL_COMMUNITY): Payer: BC Managed Care – PPO

## 2019-01-27 ENCOUNTER — Emergency Department (HOSPITAL_COMMUNITY)
Admission: EM | Admit: 2019-01-27 | Discharge: 2019-01-27 | Disposition: A | Discharge status: Home / Self Care | Payer: BC Managed Care – PPO | Attending: Emergency Medicine | Admitting: Emergency Medicine

## 2019-01-27 ENCOUNTER — Other Ambulatory Visit: Payer: Self-pay

## 2019-01-27 ENCOUNTER — Encounter (HOSPITAL_COMMUNITY): Payer: Self-pay | Admitting: Emergency Medicine

## 2019-01-27 DIAGNOSIS — Z87891 Personal history of nicotine dependence: Secondary | ICD-10-CM | POA: Diagnosis not present

## 2019-01-27 DIAGNOSIS — Y929 Unspecified place or not applicable: Secondary | ICD-10-CM | POA: Diagnosis not present

## 2019-01-27 DIAGNOSIS — Y939 Activity, unspecified: Secondary | ICD-10-CM | POA: Diagnosis not present

## 2019-01-27 DIAGNOSIS — I110 Hypertensive heart disease with heart failure: Secondary | ICD-10-CM | POA: Insufficient documentation

## 2019-01-27 DIAGNOSIS — Z79899 Other long term (current) drug therapy: Secondary | ICD-10-CM | POA: Diagnosis not present

## 2019-01-27 DIAGNOSIS — Y999 Unspecified external cause status: Secondary | ICD-10-CM | POA: Diagnosis not present

## 2019-01-27 DIAGNOSIS — I509 Heart failure, unspecified: Secondary | ICD-10-CM | POA: Insufficient documentation

## 2019-01-27 DIAGNOSIS — S4992XA Unspecified injury of left shoulder and upper arm, initial encounter: Secondary | ICD-10-CM | POA: Diagnosis present

## 2019-01-27 DIAGNOSIS — S42002A Fracture of unspecified part of left clavicle, initial encounter for closed fracture: Secondary | ICD-10-CM | POA: Diagnosis not present

## 2019-01-27 DIAGNOSIS — W19XXXA Unspecified fall, initial encounter: Secondary | ICD-10-CM | POA: Diagnosis not present

## 2019-01-27 MED ORDER — METHOCARBAMOL 750 MG PO TABS: 750.0000 mg | ORAL_TABLET | Freq: Four times a day (QID) | ORAL | 0 refills | Status: DC
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
## 2019-01-27 NOTE — ED Notes (Signed)
Pt in x-ray at this time

## 2019-01-27 NOTE — ED Notes (Signed)
Patient is back from X-ray.
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
## 2019-01-27 NOTE — Discharge Instructions (Signed)
Use Tylenol and Motrin as directed for pain °

## 2019-02-04 ENCOUNTER — Encounter: Payer: Self-pay | Admitting: Orthopedic Surgery

## 2019-02-04 ENCOUNTER — Ambulatory Visit: Payer: Self-pay

## 2019-02-04 ENCOUNTER — Other Ambulatory Visit: Payer: Self-pay

## 2019-02-04 ENCOUNTER — Ambulatory Visit (INDEPENDENT_AMBULATORY_CARE_PROVIDER_SITE_OTHER): Payer: BC Managed Care – PPO | Admitting: Orthopedic Surgery

## 2019-02-04 VITALS — Ht 73.0 in | Wt 235.0 lb

## 2019-02-04 DIAGNOSIS — M25512 Pain in left shoulder: Secondary | ICD-10-CM

## 2019-02-07 ENCOUNTER — Encounter: Payer: Self-pay | Admitting: Orthopedic Surgery
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
## 2019-02-13 ENCOUNTER — Telehealth (HOSPITAL_COMMUNITY): Payer: Self-pay | Admitting: Pharmacy Technician

## 2019-02-13 NOTE — Telephone Encounter (Signed)
Received notification from Cover My Meds that it was time for a new prior authorization on Entresto. Patient is no longer on Entresto, [place] will not be needed at this time.   Charlann Boxer, CPhT

## 2019-02-20 ENCOUNTER — Ambulatory Visit (INDEPENDENT_AMBULATORY_CARE_PROVIDER_SITE_OTHER): Payer: BC Managed Care – PPO

## 2019-02-20 ENCOUNTER — Encounter: Payer: Self-pay | Admitting: Orthopedic Surgery

## 2019-02-20 ENCOUNTER — Ambulatory Visit (INDEPENDENT_AMBULATORY_CARE_PROVIDER_SITE_OTHER): Payer: BC Managed Care – PPO | Admitting: Orthopedic Surgery

## 2019-02-20 DIAGNOSIS — M25512 Pain in left shoulder: Secondary | ICD-10-CM | POA: Diagnosis not present

## 2019-02-20 NOTE — Progress Notes (Signed)
   Post-Op Visit Note   Patient: Antonio Morgan           Date of Birth: [birth year]           MRN: [id number] Visit Date: 02/20/2019 PCP: Sinda Du, MD   Assessment & Plan:  Chief Complaint:  Chief Complaint  Patient presents with  . Left Shoulder - Follow-up   Visit Diagnoses:  1. Acute pain of left shoulder     Plan: Yanuel is doing well following left distal clavicle fracture with coracoclavicular ligament involvement.  On exam he has no pain to palpation in this region.  Good rotator cuff strength.  Actually good active and passive range of motion above 90 degrees.  To be on the safe side lets keep him in the sling for 2 more weeks and then come out of the sling with no lifting more than 5 pounds.  I will see him back in 3 weeks for final clinical check radiographs and release.  Do not anticipate surgical intervention at this time barring unforeseen circumstances.  Follow-Up Instructions: Return in about 3 weeks (around 03/13/2019).   Orders:  Orders Placed This Encounter  Procedures  . XR Clavicle Left   No orders of the defined types were placed in this encounter.   Imaging: Xr Clavicle Left  Result Date: 02/20/2019 AP left clavicle reviewed.  No change in alignment from distal clavicle fracture.  Conoid tubercle fragment is in similar relation to the coracoid process and undersurface of the clavicle.  There is been no superior migration of the medial aspect of the clavicle.   PMFS History: Patient Active Problem List   Diagnosis Date Noted  . Unilateral primary osteoarthritis, right knee 03/14/2018  . Acute pain of right knee 04/29/2017  . Rotator cuff tendinitis, left 04/29/2017  . Chronic respiratory failure with hypoxia ([place]) 12/29/2015  . Acute congestive heart failure ([place]) 12/09/2015  . CHF (congestive heart failure) ([place]) 12/09/2015  . DOE (dyspnea on exertion) 01/28/2014  . Morbid obesity ([place]) 01/28/2014  . Essential hypertension, malignant  01/28/2014  . Elevated brain natriuretic peptide (BNP) level 01/28/2014  . Gout 01/28/2014   Past Medical History:  Diagnosis Date  . Gout   . Hypertension   . Obese     Family History  Problem Relation Age of Onset  . Heart disease Father        heart transplant  . Hypertension Mother     Past Surgical History:  Procedure Laterality Date  . CARDIAC CATHETERIZATION N/A 12/13/2015   Procedure: Right/Left Heart Cath and Coronary Angiography;  Surgeon: Jolaine Artist, MD;  Location: [place] [facility];  Service: Cardiovascular;  Laterality: N/A;   Social History   Occupational History  . Occupation: guitarist  Tobacco Use  . Smoking status: Former Smoker    Packs/day: 1.00    Years: 10.00    Pack years: 10.00    Quit date: 04/23/2012    Years since quitting: 6.8  . Smokeless tobacco: Never Used  Substance and Sexual Activity  . Alcohol use: No  . Drug use: No  . Sexual activity: Not on file

## 2019-03-13 ENCOUNTER — Other Ambulatory Visit (HOSPITAL_COMMUNITY): Payer: Self-pay | Admitting: Internal Medicine

## 2019-03-17 ENCOUNTER — Encounter (HOSPITAL_COMMUNITY): Payer: BC Managed Care – PPO | Admitting: Internal Medicine

## 2019-03-17 ENCOUNTER — Ambulatory Visit (HOSPITAL_COMMUNITY): Admission: RE | Admit: 2019-03-17 | Payer: BC Managed Care – PPO | Source: Ambulatory Visit

## 2019-03-17 ENCOUNTER — Other Ambulatory Visit (HOSPITAL_COMMUNITY): Payer: Self-pay

## 2019-03-17 MED ORDER — SILDENAFIL CITRATE 100 MG PO TABS: ORAL_TABLET | ORAL | 0 refills | Status: DC

## 2019-03-18 ENCOUNTER — Telehealth (HOSPITAL_COMMUNITY): Payer: Self-pay | Admitting: Cardiology

## 2019-03-18 MED ORDER — SILDENAFIL CITRATE 100 MG PO TABS: ORAL_TABLET | ORAL | 1 refills | Status: DC

## 2019-03-18 NOTE — Telephone Encounter (Signed)
Pt called to report script for sildenafil was only one tablet  Updated rx to previous quantity

## 2019-07-16 ENCOUNTER — Other Ambulatory Visit (HOSPITAL_COMMUNITY): Payer: Self-pay | Admitting: Internal Medicine

## 2019-08-03 ENCOUNTER — Other Ambulatory Visit (HOSPITAL_COMMUNITY): Payer: Self-pay | Admitting: Internal Medicine

## 2019-08-31 ENCOUNTER — Other Ambulatory Visit (HOSPITAL_COMMUNITY): Payer: Self-pay | Admitting: Internal Medicine

## 2019-09-15 ENCOUNTER — Other Ambulatory Visit (HOSPITAL_COMMUNITY): Payer: Self-pay | Admitting: Adult Health

## 2019-09-30 ENCOUNTER — Other Ambulatory Visit (HOSPITAL_COMMUNITY): Payer: Self-pay

## 2019-09-30 MED ORDER — COLCHICINE 0.6 MG PO TABS: ORAL_TABLET | ORAL | 0 refills | Status: DC

## 2019-09-30 NOTE — Telephone Encounter (Signed)
Meds ordered this encounter  Medications   colchicine 0.6 MG tablet    Sig: TAKE 1 TABLET DAILY AS NEEDED FOR GOUT    Dispense:  30 tablet    Refill:  0    Needs to get refills from pcp

## 2019-10-06 ENCOUNTER — Other Ambulatory Visit (HOSPITAL_COMMUNITY): Payer: Self-pay | Admitting: Internal Medicine

## 2019-10-15 ENCOUNTER — Other Ambulatory Visit (HOSPITAL_COMMUNITY): Payer: Self-pay | Admitting: Adult Health

## 2019-10-15 ENCOUNTER — Other Ambulatory Visit (HOSPITAL_COMMUNITY): Payer: Self-pay | Admitting: Cardiology

## 2019-10-15 ENCOUNTER — Other Ambulatory Visit (HOSPITAL_COMMUNITY): Payer: Self-pay | Admitting: Internal Medicine

## 2019-10-15 MED ORDER — SILDENAFIL CITRATE 100 MG PO TABS: ORAL_TABLET | ORAL | 0 refills | Status: DC

## 2019-11-02 ENCOUNTER — Other Ambulatory Visit (HOSPITAL_COMMUNITY): Payer: Self-pay | Admitting: *Deleted

## 2019-11-04 ENCOUNTER — Other Ambulatory Visit (HOSPITAL_COMMUNITY): Payer: Self-pay | Admitting: *Deleted

## 2019-11-04 MED ORDER — SILDENAFIL CITRATE 100 MG PO TABS: ORAL_TABLET | ORAL | 0 refills | Status: DC

## 2019-11-06 ENCOUNTER — Other Ambulatory Visit (HOSPITAL_COMMUNITY): Payer: Self-pay | Admitting: *Deleted

## 2019-11-16 ENCOUNTER — Other Ambulatory Visit (HOSPITAL_COMMUNITY): Payer: Self-pay | Admitting: Adult Health

## 2019-11-16 ENCOUNTER — Other Ambulatory Visit (HOSPITAL_COMMUNITY): Payer: Self-pay | Admitting: Internal Medicine

## 2019-11-16 ENCOUNTER — Other Ambulatory Visit (HOSPITAL_COMMUNITY): Payer: Self-pay | Admitting: Cardiology

## 2019-11-17 ENCOUNTER — Other Ambulatory Visit (HOSPITAL_COMMUNITY): Payer: Self-pay | Admitting: *Deleted

## 2019-11-17 MED ORDER — HYDRALAZINE HCL 50 MG PO TABS: ORAL_TABLET | ORAL | 11 refills | Status: DC

## 2019-12-17 ENCOUNTER — Other Ambulatory Visit (HOSPITAL_COMMUNITY): Payer: Self-pay | Admitting: Internal Medicine

## 2019-12-21 ENCOUNTER — Other Ambulatory Visit (HOSPITAL_COMMUNITY): Payer: Self-pay | Admitting: *Deleted

## 2019-12-21 ENCOUNTER — Ambulatory Visit (HOSPITAL_COMMUNITY)
Admission: RE | Admit: 2019-12-21 | Discharge: 2019-12-21 | Disposition: A | Discharge status: Home / Self Care | Payer: BC Managed Care – PPO | Source: Ambulatory Visit | Attending: Cardiology | Admitting: Cardiology

## 2019-12-21 ENCOUNTER — Other Ambulatory Visit (HOSPITAL_COMMUNITY): Payer: Self-pay

## 2019-12-21 ENCOUNTER — Other Ambulatory Visit: Payer: Self-pay

## 2019-12-21 ENCOUNTER — Encounter (HOSPITAL_COMMUNITY): Payer: Self-pay

## 2019-12-21 VITALS — BP 142/100 | HR 69 | Wt 289.4 lb

## 2019-12-21 DIAGNOSIS — I10 Essential (primary) hypertension: Secondary | ICD-10-CM | POA: Diagnosis not present

## 2019-12-21 DIAGNOSIS — Z79899 Other long term (current) drug therapy: Secondary | ICD-10-CM | POA: Insufficient documentation

## 2019-12-21 DIAGNOSIS — I110 Hypertensive heart disease with heart failure: Secondary | ICD-10-CM | POA: Diagnosis present

## 2019-12-21 DIAGNOSIS — Z87891 Personal history of nicotine dependence: Secondary | ICD-10-CM | POA: Diagnosis not present

## 2019-12-21 DIAGNOSIS — M109 Gout, unspecified: Secondary | ICD-10-CM | POA: Diagnosis not present

## 2019-12-21 DIAGNOSIS — I5032 Chronic diastolic (congestive) heart failure: Secondary | ICD-10-CM | POA: Diagnosis not present

## 2019-12-21 DIAGNOSIS — I5022 Chronic systolic (congestive) heart failure: Secondary | ICD-10-CM | POA: Insufficient documentation

## 2019-12-21 DIAGNOSIS — Z6838 Body mass index (BMI) 38.0-38.9, adult: Secondary | ICD-10-CM | POA: Insufficient documentation

## 2019-12-21 DIAGNOSIS — I428 Other cardiomyopathies: Secondary | ICD-10-CM | POA: Diagnosis not present

## 2019-12-21 DIAGNOSIS — G4733 Obstructive sleep apnea (adult) (pediatric): Secondary | ICD-10-CM | POA: Insufficient documentation

## 2019-12-21 DIAGNOSIS — Z8249 Family history of ischemic heart disease and other diseases of the circulatory system: Secondary | ICD-10-CM | POA: Insufficient documentation

## 2019-12-21 LAB — BASIC METABOLIC PANEL
Anion gap: 7 (ref 5–15)
BUN: 13 mg/dL (ref 6–20)
CO2: 27 mmol/L (ref 22–32)
Calcium: 9.2 mg/dL (ref 8.9–10.3)
Chloride: 106 mmol/L (ref 98–111)
Creatinine, Ser: 1.24 mg/dL (ref 0.61–1.24)
GFR calc Af Amer: >60 mL/min (ref >60)
GFR calc non Af Amer: >60 mL/min (ref >60)
Glucose, Bld: 95 mg/dL (ref 70–99)
Potassium: 4.4 mmol/L (ref 3.5–5.1)
Sodium: 140 mmol/L (ref 135–145)

## 2019-12-21 MED ORDER — HYDRALAZINE HCL 100 MG PO TABS: 100.0000 mg | ORAL_TABLET | Freq: Three times a day (TID) | ORAL | 3 refills | Status: DC

## 2019-12-21 MED ORDER — FUROSEMIDE 40 MG PO TABS: 40.0000 mg | ORAL_TABLET | ORAL | 3 refills | Status: DC | PRN

## 2019-12-21 NOTE — Progress Notes (Signed)
Advanced Heart Failure Clinic Note  PCP: Dr Juanetta Gosling  Primary Cardiologist:Dr Bensimhon   HPI: Antonio Morgan is a 39 y.o. male with a past medical history of HTN and obesity diagnosed with acute systolic heart failure august 9449.   He presented to [facility] ED on 12/08/2015 for worsening dyspnea over the last year.  On admit , he was tachycardiac into the low-100's. Oxygen saturations were initially 85%. CXR on admit showed vascular congestion. CTA was negative for PE. ECHO was performed and showed severely reduced LVEF 10-15% and RV moderately/severely dilated. Diuresed with IV lasix and transitioned to lasix 40 mg twice daily. RHC/LHC 12/13/2015 with normal cors and well compensated hemodynamics. Also discharged on 2 liters oxygen continuously. Discharge weight was 352 pounds.   He presents to clinic today for f/u. Has been over 1 year since his last OV. Seen 10/2018. Was doing well at that time and had lost >140 lb with lifestyle changes. At that visit, Sherryll Burger was discontinued due to dizziness/ ortho stasis. He was changed to losartan 100 mg daily. He was instructed to get a repeat echo but failed to do so.   Today in f/u, he reports that he "feels great". Remains active. Works out at [employer] doing cardio and [employer]. He reports his home wt's have been stable in the 250 lb range, but our clinic scale reads 280 lb. He is bothered by this. Thinks scale is off. ReDs clip measurement also elevated at 41%. He is NYHA Class I. He denies exertional dyspnea. No orthopnea/PND. No LEE. He denies intake of sports drinks post work-outs but not particularly mindful of sodium content w/ day to day diet. His BP is elevated today at 142/100 (checked x 2). He reports full med compliance. Denies CP. His lasix is ordered only PRN but he has not taken this in "several months" . No longer using CPAP after his wt loss.    Echo 12/2017 EF 55%.  ECHO 12/09/2015 10-15% RV mod-severely dilated.  ECHO 03/23/2016 EF 35-40%    RHC/LHC 12/13/2015 RA = 6 RV = 44/8/10 PA = 43/17 (31) PCW = 13 Fick cardiac output/index = 7.6/2.8 PVR = 2.4 WU SVR = 855  Ao sat = 94%  PA sat = 67%, 70% Assessment: 1. Essentially normal coronaries 2. Well compensated hemodynamics with mild PAH   Review of systems complete and found to be negative unless listed in HPI.    SH:  Social History   Socioeconomic History  . Marital status: Single    Spouse name: Not on file  . Number of children: Not on file  . Years of education: 24  . Highest education level: Not on file  Occupational History  . Occupation: guitarist  Tobacco Use  . Smoking status: Former Smoker    Packs/day: 1.00    Years: 10.00    Pack years: 10.00    Quit date: 04/23/2012    Years since quitting: 7.6  . Smokeless tobacco: Never Used  Substance and Sexual Activity  . Alcohol use: No  . Drug use: No  . Sexual activity: Not on file  Other Topics Concern  . Not on file  Social History Narrative  . Not on file   Social Determinants of Health   Financial Resource Strain:   . Difficulty of Paying Living Expenses: Not on file  Food Insecurity:   . Worried About Programme researcher, broadcasting/film/video in the Last Year: Not on file  . Ran Out of Food in the  Last Year: Not on file  Transportation Needs:   . Lack of Transportation (Medical): Not on file  . Lack of Transportation (Non-Medical): Not on file  Physical Activity:   . Days of Exercise per Week: Not on file  . Minutes of Exercise per Session: Not on file  Stress:   . Feeling of Stress : Not on file  Social Connections:   . Frequency of Communication with Friends and Family: Not on file  . Frequency of Social Gatherings with Friends and Family: Not on file  . Attends Religious Services: Not on file  . Active Member of Clubs or Organizations: Not on file  . Attends Banker Meetings: Not on file  . Marital Status: Not on file  Intimate Partner Violence:   . Fear of Current or Ex-Partner:  Not on file  . Emotionally Abused: Not on file  . Physically Abused: Not on file  . Sexually Abused: Not on file    FH:  Family History  Problem Relation Age of Onset  . Heart disease Father        heart transplant  . Hypertension Mother     Past Medical History:  Diagnosis Date  . Gout   . Hypertension   . Obese     Current Outpatient Medications  Medication Sig Dispense Refill  . acetaminophen (TYLENOL) 325 MG tablet Take 650 mg by mouth every 6 (six) hours as needed for mild pain.     . carvedilol (COREG) 25 MG tablet TAKE (1) TABLET TWICE A DAY. 60 tablet 0  . colchicine 0.6 MG tablet TAKE 1 TABLET DAILY AS NEEDED FOR GOUT 30 tablet 0  . furosemide (LASIX) 40 MG tablet Take 40 mg by mouth as needed.    . hydrALAZINE (APRESOLINE) 50 MG tablet TAKE 1 & 1/2 TABLET THREE TIMES DAILY 135 tablet 11  . ibuprofen (ADVIL,MOTRIN) 600 MG tablet Take 1 tablet (600 mg total) by mouth every 6 (six) hours as needed. 30 tablet 0  . isosorbide mononitrate (IMDUR) 60 MG 24 hr tablet TAKE 2 TABLETS DAILY 60 tablet 0  . lidocaine (LIDODERM) 5 % Place 1 patch onto the skin daily. Remove & Discard patch within 12 hours or as directed by MD 30 patch 0  . losartan (COZAAR) 100 MG tablet TAKE 1 TABLET ONCE DAILY IN THE EVENING 30 tablet 0  . methocarbamol (ROBAXIN-750) 750 MG tablet Take 1 tablet (750 mg total) by mouth 4 (four) times daily. 30 tablet 0  . sildenafil (VIAGRA) 100 MG tablet TAKE 1 TABLET AS NEEDED FOR ERECTILE DYSFUNCTION. DO NOT TAKE ON DAYS YOU TAKE ISOSORBIDE 4 tablet 0   No current facility-administered medications for this encounter.   Vitals:   12/21/19 1141  BP: (!) 142/100  Pulse: 69  SpO2: 96%  Weight: 131.3 kg (289 lb 6.4 oz)   Filed Weights   12/21/19 1141  Weight: 131.3 kg (289 lb 6.4 oz)   Wt Readings from Last 3 Encounters:  12/21/19 131.3 kg (289 lb 6.4 oz)  02/04/19 106.6 kg (235 lb)  01/27/19 104.3 kg (230 lb)    PHYSICAL EXAM: ReDs clip  41% General:  Well appearing, young male, moderately obese. No respiratory difficulty HEENT: normal Neck: supple. no JVD. Carotids 2+ bilat; no bruits. No lymphadenopathy or thyromegaly appreciated. Cor: PMI nondisplaced. Regular rate & rhythm. No rubs, gallops or murmurs. Lungs: clear Abdomen: soft, nontender, nondistended. No hepatosplenomegaly. No bruits or masses. Good bowel sounds. Extremities: no cyanosis, clubbing,  rash, edema Neuro: alert & oriented x 3, cranial nerves grossly intact. moves all 4 extremities w/o difficulty. Affect pleasant.   ASSESSMENT & PLAN: 1. Chronic Systolic Heart Failure- NICM (likely HTN) - cors ok on 12/09/2015. ECHO 12/09/2015 EF 10-15%. ECHO 12/17. EF 35-40%   - EF recovered 2019 to 55%.  - NYHA I. Volume status up by ReDs Clip, 41%. - Advised to take Lasix 40 mg daily x 2 days, then return to PRN  - Continue coreg 25 mg BID. - Increase hydralazine to 100 mg TID for better BP control - Continue imdur 120 mg daily (warnned against concomitant use w/ Viagra, on med list)  - Continue losartan 100 mg daily (did not tolerate Entresto, orthostatic hypotension)  - Check repeat echo. If EF back down, will add SGLT2i   -Check BMET today  - We discussed low sodium diet (advised to read food labels). Monitor wt daily   2. HTN - Elevated today, in the setting of fluid overload - increase diuretics per above - increase hydralazine to 100 mg tid  - restrict sodium  - Follow up in 2 weeks to reassess BP. If BP remains elevated, would recommend repeat sleep study to reassess for OSA  3. H/o OSA - no longer using CPAP. He reports he was told by another provider that he no longer needed therapy after wt loss - BP remains high today despite reported med compliance. Will follow closely. If he continues w/ persistent HTN despite med compliance, would recommend repeat sleep study to reassess  4. Morbid Obesity  Body mass index is 38.18 kg/m. - he has overall lost a  significant amount of wt w/ lifestyle modifications.  - encouraged to continue routine physical activity   5.  H/o Gout:  - no recent flares.  - continue allopurinol    F/u in 2-3 weeks to reassess BP and volume status. Check echo at visit.   Robbie Lis, PA-C  11:53 AM

## 2019-12-21 NOTE — Progress Notes (Signed)
ReDS Vest / Clip - 12/21/19 1200      ReDS Vest / Clip   Station Marker D    Ruler Value 38    ReDS Value Range High volume overload    ReDS Actual Value 41    Anatomical Comments sitting

## 2019-12-21 NOTE — Patient Instructions (Signed)
Take Lasix 40 mg daily for two days INCREASE Hydralazine to 100 mg, one tab three times daily  Labs today We will only contact you if something comes back abnormal or we need to make some changes. Otherwise no news is good news!  Your physician recommends that you schedule a follow-up appointment in: 2-3 weeks  in the Advanced Practitioners (PA/NP) Clinic   Your physician has requested that you have an echocardiogram. Echocardiography is a painless test that uses sound waves to create images of your heart. It provides your doctor with information about the size and shape of your heart and how well your heart's chambers and valves are working. This procedure takes approximately one hour. There are no restrictions for this procedure.  Do the following things EVERYDAY: 1) Weigh yourself in the morning before breakfast. Write it down and keep it in a log. 2) Take your medicines as prescribed 3) Eat low salt foods--Limit salt (sodium) to 2000 mg per day.  4) Stay as active as you can everyday 5) Limit all fluids for the day to less than 2 liters  If you have any questions or concerns before your next appointment please send [place] a message through [place] or call our office at [phone].    TO LEAVE A MESSAGE FOR THE NURSE SELECT OPTION 2, PLEASE LEAVE A MESSAGE INCLUDING: . YOUR NAME . DATE OF BIRTH . CALL BACK NUMBER . REASON FOR CALL**this is important as we prioritize the call backs  YOU WILL RECEIVE A CALL BACK THE SAME DAY AS LONG AS YOU CALL BEFORE 4:00 PM

## 2020-01-12 ENCOUNTER — Encounter (HOSPITAL_COMMUNITY): Payer: BC Managed Care – PPO

## 2020-01-12 ENCOUNTER — Ambulatory Visit (HOSPITAL_COMMUNITY): Payer: BC Managed Care – PPO

## 2020-01-15 ENCOUNTER — Other Ambulatory Visit (HOSPITAL_COMMUNITY): Payer: Self-pay | Admitting: Cardiology

## 2020-01-26 ENCOUNTER — Ambulatory Visit (HOSPITAL_COMMUNITY): Admission: RE | Admit: 2020-01-26 | Payer: BC Managed Care – PPO | Source: Ambulatory Visit

## 2020-01-26 ENCOUNTER — Encounter (HOSPITAL_COMMUNITY): Payer: BC Managed Care – PPO

## 2020-01-28 ENCOUNTER — Encounter (HOSPITAL_COMMUNITY): Payer: Self-pay

## 2020-02-15 ENCOUNTER — Telehealth (HOSPITAL_COMMUNITY): Payer: Self-pay

## 2020-02-16 NOTE — Telephone Encounter (Signed)
error 

## 2020-02-18 ENCOUNTER — Other Ambulatory Visit (HOSPITAL_COMMUNITY): Payer: Self-pay | Admitting: Cardiology

## 2020-03-19 ENCOUNTER — Other Ambulatory Visit (HOSPITAL_COMMUNITY): Payer: Self-pay | Admitting: Cardiology

## 2020-04-18 ENCOUNTER — Other Ambulatory Visit (HOSPITAL_COMMUNITY): Payer: Self-pay | Admitting: Cardiology

## 2020-04-22 ENCOUNTER — Other Ambulatory Visit (HOSPITAL_COMMUNITY): Payer: Self-pay | Admitting: Cardiology

## 2020-04-25 ENCOUNTER — Other Ambulatory Visit (HOSPITAL_COMMUNITY): Payer: Self-pay | Admitting: *Deleted

## 2020-04-25 ENCOUNTER — Telehealth (HOSPITAL_COMMUNITY): Payer: Self-pay | Admitting: Cardiology

## 2020-04-25 DIAGNOSIS — I5032 Chronic diastolic (congestive) heart failure: Secondary | ICD-10-CM

## 2020-04-25 MED ORDER — ISOSORBIDE MONONITRATE ER 60 MG PO TB24: 120.0000 mg | ORAL_TABLET | Freq: Every day | ORAL | 0 refills | Status: DC

## 2020-04-25 MED ORDER — CARVEDILOL 25 MG PO TABS: 25.0000 mg | ORAL_TABLET | Freq: Two times a day (BID) | ORAL | 0 refills | Status: DC

## 2020-04-25 NOTE — Telephone Encounter (Signed)
Pt request carvedilol, and isosorbide mononitrate refills, pt scheduled appt next available, pt can be reached @[phone]. Thanks

## 2020-05-18 ENCOUNTER — Other Ambulatory Visit (HOSPITAL_COMMUNITY): Payer: Self-pay | Admitting: Cardiology

## 2020-05-26 ENCOUNTER — Other Ambulatory Visit (HOSPITAL_COMMUNITY): Payer: Self-pay | Admitting: Cardiology

## 2020-05-29 NOTE — Progress Notes (Addendum)
Advanced Heart Failure Clinic Note  PCP: Dr. Juanetta Gosling  HF Cardiologist: Dr. Gala Romney   HPI: Antonio Morgan is a 40 y.o. male with a past medical history of HTN and obesity diagnosed with acute systolic heart failure august 6712.   He presented to [facility] ED on 12/08/2015 for worsening dyspnea over the last year.  On admit , he was tachycardiac into the low-100's. Oxygen saturations were initially 85%. CXR on admit showed vascular congestion. CTA was negative for PE. ECHO was performed and showed severely reduced LVEF 10-15% and RV moderately/severely dilated. Diuresed with IV lasix and transitioned to lasix 40 mg twice daily. RHC/LHC 12/13/2015 with normal cors and well compensated hemodynamics. Also discharged on 2 liters oxygen continuously. Discharge weight was 352 pounds.   He presents to clinic today for f/u. Has been over 1 year since his last OV. Seen 10/2018. Was doing well at that time and had lost >140 lb with lifestyle changes. At that visit, Sherryll Burger was discontinued due to dizziness/ ortho stasis. He was changed to losartan 100 mg daily. He was instructed to get a repeat echo but failed to do so.   Follow up for CHF 8/21, he felt great. Remains active. He reports his home wt's have been stable in the 250 lb range, but our clinic scale reads 280 lb. His BP was elevated 142/100 (checked x 2).  His lasix is PRN but he has not taken this in "several months". No longer using CPAP after his wt loss. Hydralazine was increased and lasix scheduled for 2 days then back to PRN.  Today he returns for HF follow up. Overall feeling fine physically. Working out daily, cardio and weights. Denies increasing SOB, CP, dizziness, edema, or PND/Orthopnea. Appetite ok. No fever or chills. Weight at home 280-285 pounds. Taking all medications. 3 family members recently died and best fried committed suicide last week. 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 Having a hard time dealing with heart failure  diagnosis as he feels he's trying to do everything he can to be healthy.  Echo 2/22: EF 40-45%, moderate LVH, Grade 1 DD, hypokinesis of basilar to mid-inferior lateral wall Echo 12/2017: EF 55%.  Echo 12/09/2015: 10-15% RV mod-severely dilated.  Echo 03/23/2016: EF 35-40%   RHC/LHC 12/13/2015 RA = 6 RV = 44/8/10 PA = 43/17 (31) PCW = 13 Fick cardiac output/index = 7.6/2.8 PVR = 2.4 WU SVR = 855  Ao sat = 94%  PA sat = 67%, 70% Assessment: 1. Essentially normal coronaries 2. Well compensated hemodynamics with mild PAH  Review of systems complete and found to be negative unless listed in HPI.    SH:  Social History   Socioeconomic History  . Marital status: Single    Spouse name: Not on file  . Number of children: Not on file  . Years of education: 49  . Highest education level: Not on file  Occupational History  . Occupation: guitarist  Tobacco Use  . Smoking status: Former Smoker    Packs/day: 1.00    Years: 10.00    Pack years: 10.00    Quit date: 04/23/2012    Years since quitting: 8.1  . Smokeless tobacco: Never Used  Substance and Sexual Activity  . Alcohol use: No  . Drug use: No  . Sexual activity: Not on file  Other Topics Concern  . Not on file  Social History Narrative  . Not on file   Social Determinants of Health   Financial  Resource Strain: Not on file  Food Insecurity: Not on file  Transportation Needs: Not on file  Physical Activity: Not on file  Stress: Not on file  Social Connections: Not on file  Intimate Partner Violence: Not on file    FH:  Family History  Problem Relation Age of Onset  . Heart disease Father        heart transplant  . Hypertension Mother     Past Medical History:  Diagnosis Date  . Gout   . Hypertension   . Obese     Current Outpatient Medications  Medication Sig Dispense Refill  . acetaminophen (TYLENOL) 325 MG tablet Take 650 mg by mouth every 6 (six) hours as needed for mild pain.     . carvedilol  (COREG) 25 MG tablet TAKE (1) TABLET TWICE A DAY. 60 tablet 0  . colchicine 0.6 MG tablet TAKE 1 TABLET DAILY AS NEEDED FOR GOUT 30 tablet 0  . dapagliflozin propanediol (FARXIGA) 10 MG TABS tablet Take 1 tablet (10 mg total) by mouth daily before breakfast. 30 tablet 11  . furosemide (LASIX) 40 MG tablet TAKE 1 TABLET DAILY AS NEEDED 30 tablet 0  . hydrALAZINE (APRESOLINE) 100 MG tablet Take 1 tablet (100 mg total) by mouth 3 (three) times daily. 90 tablet 3  . ibuprofen (ADVIL,MOTRIN) 600 MG tablet Take 1 tablet (600 mg total) by mouth every 6 (six) hours as needed. 30 tablet 0  . isosorbide mononitrate (IMDUR) 60 MG 24 hr tablet TAKE 2 TABLETS DAILY 60 tablet 0  . losartan (COZAAR) 100 MG tablet TAKE 1 TABLET ONCE DAILY IN THE EVENING 30 tablet 0  . sertraline (ZOLOFT) 50 MG tablet Take 0.5 tablets (25 mg total) by mouth daily for 14 days, THEN 1 tablet (50 mg total) daily. 30 tablet 2  . sildenafil (VIAGRA) 100 MG tablet TAKE 1 TABLET AS NEEDED FOR ERECTILE DYSFUNCTION. DO NOT TAKE ON DAYS YOU TAKE ISOSORBIDE 4 tablet 0   No current facility-administered medications for this encounter.   Vitals:   05/30/20 1412  BP: (!) 135/94  Pulse: 64  SpO2: 98%  Weight: 130.3 kg (287 lb 3.2 oz)   Filed Weights   05/30/20 1412  Weight: 130.3 kg (287 lb 3.2 oz)   Wt Readings from Last 3 Encounters:  05/30/20 130.3 kg (287 lb 3.2 oz)  12/21/19 131.3 kg (289 lb 6.4 oz)  02/04/19 106.6 kg (235 lb)    PHYSICAL EXAM: General:  NAD. No resp difficulty. HEENT: Normal Neck: Supple. No JVD. Carotids 2+ bilat; no bruits. No lymphadenopathy or thryomegaly appreciated. Cor: PMI nondisplaced. Regular rate & rhythm. No rubs, gallops or murmurs. Lungs: Clear Abdomen: Soft, nontender, nondistended. No hepatosplenomegaly. No bruits or masses. Good bowel sounds. Extremities: No cyanosis, clubbing, rash, edema Neuro: alert & oriented x 3, cranial nerves grossly intact. Moves all 4 extremities w/o difficulty.  Affect pleasant.  ReDs: 37% ECG: SR 63 bpm, ?AV block, qrs 128 ms (personally reviewed).  ASSESSMENT & PLAN: 1. Chronic Systolic Heart Failure- NICM (likely HTN) - Cors ok on 12/09/2015. ECHO 12/09/2015 EF 10-15%. ECHO 12/17. EF 35-40%   - EF recovered 2019 to 55%.  - Echo (today) 2/22: EF 40-45%, moderate LVH, Grade 1 DD, hypokinesis of basilar to mid-inferior lateral wall - NYHA I. Volume good today, ReDs 37% - Start Farxiga 10 mg daily. - Continue PRN lasix. - Continue carvedilol 25 mg bid. - Continue hydralazine 100 mg tid. - Continue imdur 120 mg daily (warned  against concomitant use w/ Viagra, on med list). Has not used Viagra in several months. - Continue losartan 100 mg daily (did not tolerate Entresto, orthostatic hypotension). - With new hypokinesis of basilar, mid-inferior lateral wall, will get cMRI.  -Check BMET today, repeat 7-10 days.  2. HTN - Slightly elevated today, says he has been stressed with recent family deaths. - Continue hydralazine 100 mg tid + imdur 120 mg daily. - Encouraged him to check BPs at home and log.  3. H/o OSA - No longer using CPAP. He reports he was told by another provider that he no longer needed therapy after wt loss - Will follow closely. If he continues w/ persistent HTN despite med compliance, would recommend repeat sleep study to reassess.  4. Morbid Obesity  Body mass index is 37.89 kg/m. - He has overall lost a significant amount of wt w/ lifestyle modifications.  - Encouraged to continue routine physical activity.   5. H/o Gout:  - No recent flares.  - Continue allopurinol.   6. Depression, likely reactive - Overwhelmed with recent family deaths and frustrated that he is not making more progress with his heart failure despite strict diet and daily workouts. - Start sertraline 25 mg daily x 2 weeks, then increase to 50 mg daily. No SI/HI. - Instructed to follow up with PCP for closer monitoring of depression and encouraged  counseling.  Follow up with Dr. Gala Romney 3 months.  Jacklynn Ganong, FNP -[facility] 05/30/20 2:56 PM

## 2020-05-30 ENCOUNTER — Ambulatory Visit (HOSPITAL_COMMUNITY)
Admission: RE | Admit: 2020-05-30 | Discharge: 2020-05-30 | Disposition: A | Discharge status: Home / Self Care | Payer: BC Managed Care – PPO | Source: Ambulatory Visit | Attending: Pulmonary Disease | Admitting: Pulmonary Disease

## 2020-05-30 ENCOUNTER — Ambulatory Visit (HOSPITAL_BASED_OUTPATIENT_CLINIC_OR_DEPARTMENT_OTHER)
Admission: RE | Admit: 2020-05-30 | Discharge: 2020-05-30 | Disposition: A | Discharge status: Home / Self Care | Payer: BC Managed Care – PPO | Source: Ambulatory Visit | Attending: Family Medicine | Admitting: Family Medicine

## 2020-05-30 ENCOUNTER — Other Ambulatory Visit: Payer: Self-pay

## 2020-05-30 ENCOUNTER — Encounter (HOSPITAL_COMMUNITY): Payer: Self-pay

## 2020-05-30 VITALS — BP 135/94 | HR 64 | Wt 287.2 lb

## 2020-05-30 DIAGNOSIS — I5032 Chronic diastolic (congestive) heart failure: Secondary | ICD-10-CM

## 2020-05-30 DIAGNOSIS — Z8739 Personal history of other diseases of the musculoskeletal system and connective tissue: Secondary | ICD-10-CM

## 2020-05-30 DIAGNOSIS — I509 Heart failure, unspecified: Secondary | ICD-10-CM

## 2020-05-30 DIAGNOSIS — I5022 Chronic systolic (congestive) heart failure: Secondary | ICD-10-CM | POA: Insufficient documentation

## 2020-05-30 DIAGNOSIS — Z6837 Body mass index (BMI) 37.0-37.9, adult: Secondary | ICD-10-CM | POA: Insufficient documentation

## 2020-05-30 DIAGNOSIS — I110 Hypertensive heart disease with heart failure: Secondary | ICD-10-CM | POA: Insufficient documentation

## 2020-05-30 DIAGNOSIS — F32A Depression, unspecified: Secondary | ICD-10-CM | POA: Diagnosis not present

## 2020-05-30 DIAGNOSIS — I10 Essential (primary) hypertension: Secondary | ICD-10-CM | POA: Diagnosis not present

## 2020-05-30 DIAGNOSIS — G4733 Obstructive sleep apnea (adult) (pediatric): Secondary | ICD-10-CM

## 2020-05-30 DIAGNOSIS — F329 Major depressive disorder, single episode, unspecified: Secondary | ICD-10-CM

## 2020-05-30 DIAGNOSIS — Z87891 Personal history of nicotine dependence: Secondary | ICD-10-CM | POA: Insufficient documentation

## 2020-05-30 DIAGNOSIS — M109 Gout, unspecified: Secondary | ICD-10-CM | POA: Diagnosis not present

## 2020-05-30 DIAGNOSIS — Z79899 Other long term (current) drug therapy: Secondary | ICD-10-CM | POA: Insufficient documentation

## 2020-05-30 LAB — BASIC METABOLIC PANEL
Anion gap: 9 (ref 5–15)
BUN: 13 mg/dL (ref 6–20)
CO2: 28 mmol/L (ref 22–32)
Calcium: 9.4 mg/dL (ref 8.9–10.3)
Chloride: 102 mmol/L (ref 98–111)
Creatinine, Ser: 1.43 mg/dL — ABNORMAL HIGH (ref 0.61–1.24)
GFR, Estimated: >60 mL/min (ref >60)
Glucose, Bld: 90 mg/dL (ref 70–99)
Potassium: 4.1 mmol/L (ref 3.5–5.1)
Sodium: 139 mmol/L (ref 135–145)

## 2020-05-30 LAB — ECHOCARDIOGRAM COMPLETE
Area-P 1/2: 3.12 cm2
S' Lateral: 3.5 cm

## 2020-05-30 MED ORDER — SERTRALINE HCL 50 MG PO TABS: ORAL_TABLET | ORAL | 2 refills | Status: AC

## 2020-05-30 MED ORDER — DAPAGLIFLOZIN PROPANEDIOL 10 MG PO TABS: 10.0000 mg | ORAL_TABLET | Freq: Every day | ORAL | 11 refills | Status: DC

## 2020-05-30 NOTE — Progress Notes (Signed)
Echocardiogram 2D Echocardiogram has been performed.  Antonio Morgan 05/30/2020, 1:34 PM

## 2020-05-30 NOTE — Patient Instructions (Signed)
START Farxiga 10 mg, one tab daily START Zoloft 25 mg, (one half tab) daily for 2 weeks, then increase to 50 mg (one tab) daily   Labs today We will only contact you if something comes back abnormal or we need to make some changes. Otherwise no news is good news!  Labs needed in 7-10 days   Your physician recommends that you schedule a follow-up appointment in: 2-3 months with Dr Gala Romney  Do the following things EVERYDAY: 1) Weigh yourself in the morning before breakfast. Write it down and keep it in a log. 2) Take your medicines as prescribed 3) Eat low salt foods-Limit salt (sodium) to 2000 mg per day.  4) Stay as active as you can everyday 5) Limit all fluids for the day to less than 2 liters  At the Advanced Heart Failure Clinic, you and your health needs are our priority. As part of our continuing mission to provide you with exceptional heart care, we have created designated Provider Care Teams. These Care Teams include your primary Cardiologist (physician) and Advanced Practice Providers (APPs- Physician Assistants and Nurse Practitioners) who all work together to provide you with the care you need, when you need it.   You may see any of the following providers on your designated Care Team at your next follow up: Marland Kitchen Dr Arvilla Meres . Dr Marca Ancona . Tonye Becket, NP . Robbie Lis, PA . Shanda Bumps [place],NP . Karle Plumber, PharmD   Please be sure to bring in all your medications bottles to every appointment.    If you have any questions or concerns before your next appointment please send [place] a message through [place] or call our office at [phone].    TO LEAVE A MESSAGE FOR THE NURSE SELECT OPTION 2, PLEASE LEAVE A MESSAGE INCLUDING: . YOUR NAME . DATE OF BIRTH . CALL BACK NUMBER . REASON FOR CALL**this is important as we prioritize the call backs  YOU WILL RECEIVE A CALL BACK THE SAME DAY AS LONG AS YOU CALL BEFORE 4:00 PM

## 2020-05-30 NOTE — Progress Notes (Signed)
ReDS Vest / Clip - 05/30/20 1400      ReDS Vest / Clip   Station Marker D    Ruler Value 38    ReDS Value Range Moderate volume overload    ReDS Actual Value 37

## 2020-06-06 ENCOUNTER — Other Ambulatory Visit (HOSPITAL_COMMUNITY): Payer: Self-pay | Admitting: Cardiology

## 2020-06-06 ENCOUNTER — Ambulatory Visit (HOSPITAL_COMMUNITY)
Admission: RE | Admit: 2020-06-06 | Discharge: 2020-06-06 | Disposition: A | Discharge status: Home / Self Care | Payer: BC Managed Care – PPO | Source: Ambulatory Visit | Attending: Internal Medicine | Admitting: Internal Medicine

## 2020-06-06 ENCOUNTER — Other Ambulatory Visit: Payer: Self-pay

## 2020-06-06 DIAGNOSIS — I10 Essential (primary) hypertension: Secondary | ICD-10-CM | POA: Insufficient documentation

## 2020-06-06 LAB — BASIC METABOLIC PANEL
Anion gap: 11 (ref 5–15)
BUN: 22 mg/dL — ABNORMAL HIGH (ref 6–20)
CO2: 24 mmol/L (ref 22–32)
Calcium: 9.5 mg/dL (ref 8.9–10.3)
Chloride: 103 mmol/L (ref 98–111)
Creatinine, Ser: 1.25 mg/dL — ABNORMAL HIGH (ref 0.61–1.24)
GFR, Estimated: >60 mL/min (ref >60)
Glucose, Bld: 90 mg/dL (ref 70–99)
Potassium: 4.2 mmol/L (ref 3.5–5.1)
Sodium: 138 mmol/L (ref 135–145)

## 2020-06-06 NOTE — Progress Notes (Signed)
bm 

## 2020-06-23 ENCOUNTER — Other Ambulatory Visit (HOSPITAL_COMMUNITY): Payer: Self-pay | Admitting: Cardiology

## 2020-07-05 ENCOUNTER — Telehealth (HOSPITAL_COMMUNITY): Payer: Self-pay | Admitting: Licensed Clinical Social Worker

## 2020-07-05 NOTE — Telephone Encounter (Signed)
Patient referred for possible candidate for the [facility]. CSW mailed an informational flyer on meetings. Jackie Brennan, LCSW, CCSW-MCS [phone] 

## 2020-07-22 ENCOUNTER — Other Ambulatory Visit (HOSPITAL_COMMUNITY): Payer: Self-pay | Admitting: Internal Medicine

## 2020-07-23 ENCOUNTER — Other Ambulatory Visit (HOSPITAL_COMMUNITY): Payer: Self-pay | Admitting: Internal Medicine

## 2020-07-28 ENCOUNTER — Encounter (HOSPITAL_COMMUNITY): Payer: BC Managed Care – PPO | Admitting: Internal Medicine

## 2020-08-22 ENCOUNTER — Other Ambulatory Visit (HOSPITAL_COMMUNITY): Payer: Self-pay | Admitting: Internal Medicine

## 2020-09-30 ENCOUNTER — Telehealth (HOSPITAL_COMMUNITY): Payer: Self-pay | Admitting: Vascular Surgery

## 2020-09-30 NOTE — Telephone Encounter (Signed)
Left pt message that 6/13 2:20 pm appt will be moved up to 1:40 pm , asked pt to call back to confirm appt  time change

## 2020-10-03 ENCOUNTER — Encounter (HOSPITAL_COMMUNITY): Payer: BC Managed Care – PPO | Admitting: Internal Medicine

## 2020-11-10 ENCOUNTER — Telehealth (HOSPITAL_COMMUNITY): Payer: Self-pay | Admitting: *Deleted

## 2020-11-10 NOTE — Telephone Encounter (Signed)
Pt left vm stating he tested positive for covid and isnt feeling well. Pt said the health dept told him to contact our office for covid medication.routed to [employer] for advice. ( Should pt contact pcp?)

## 2020-11-10 NOTE — Telephone Encounter (Signed)
Left detailed vm °

## 2020-11-26 ENCOUNTER — Other Ambulatory Visit (HOSPITAL_COMMUNITY): Payer: Self-pay | Admitting: Cardiology

## 2020-11-30 ENCOUNTER — Other Ambulatory Visit (HOSPITAL_COMMUNITY): Payer: Self-pay | Admitting: *Deleted

## 2020-11-30 MED ORDER — COLCHICINE 0.6 MG PO TABS: ORAL_TABLET | ORAL | 0 refills | Status: AC

## 2021-03-09 ENCOUNTER — Telehealth (HOSPITAL_COMMUNITY): Payer: Self-pay

## 2021-03-09 NOTE — Telephone Encounter (Signed)
Received a fax requesting medical records from [employer].. Records were successfully faxed to: [phone] ,which was the number provided.. Medical request form will be scanned into patients chart.

## 2021-05-30 ENCOUNTER — Other Ambulatory Visit (HOSPITAL_COMMUNITY): Payer: Self-pay | Admitting: Family Medicine

## 2021-05-30 ENCOUNTER — Other Ambulatory Visit (HOSPITAL_COMMUNITY): Payer: Self-pay | Admitting: Internal Medicine

## 2021-07-03 ENCOUNTER — Other Ambulatory Visit (HOSPITAL_COMMUNITY): Payer: Self-pay | Admitting: Internal Medicine

## 2021-08-01 ENCOUNTER — Other Ambulatory Visit (HOSPITAL_COMMUNITY): Payer: Self-pay | Admitting: Internal Medicine

## 2021-09-04 ENCOUNTER — Other Ambulatory Visit (HOSPITAL_COMMUNITY): Payer: Self-pay | Admitting: Internal Medicine

## 2021-10-03 ENCOUNTER — Other Ambulatory Visit (HOSPITAL_COMMUNITY): Payer: Self-pay | Admitting: Internal Medicine

## 2021-11-03 ENCOUNTER — Other Ambulatory Visit (HOSPITAL_COMMUNITY): Payer: Self-pay | Admitting: Internal Medicine

## 2021-11-28 ENCOUNTER — Other Ambulatory Visit (HOSPITAL_COMMUNITY): Payer: Self-pay | Admitting: Internal Medicine

## 2021-11-28 MED ORDER — HYDRALAZINE HCL 100 MG PO TABS: 100.0000 mg | ORAL_TABLET | Freq: Three times a day (TID) | ORAL | 0 refills | Status: DC

## 2021-12-14 ENCOUNTER — Other Ambulatory Visit (HOSPITAL_COMMUNITY): Payer: Self-pay | Admitting: Internal Medicine

## 2022-01-05 ENCOUNTER — Other Ambulatory Visit (HOSPITAL_COMMUNITY): Payer: Self-pay | Admitting: Internal Medicine

## 2022-01-11 ENCOUNTER — Other Ambulatory Visit (HOSPITAL_COMMUNITY): Payer: Self-pay | Admitting: Internal Medicine

## 2022-02-10 ENCOUNTER — Other Ambulatory Visit (HOSPITAL_COMMUNITY): Payer: Self-pay | Admitting: Internal Medicine

## 2022-03-01 ENCOUNTER — Other Ambulatory Visit (HOSPITAL_COMMUNITY): Payer: Self-pay | Admitting: Internal Medicine

## 2023-01-22 ENCOUNTER — Other Ambulatory Visit (HOSPITAL_COMMUNITY): Payer: Self-pay

## 2023-01-22 DIAGNOSIS — R0609 Other forms of dyspnea: Secondary | ICD-10-CM

## 2023-01-25 ENCOUNTER — Encounter (HOSPITAL_COMMUNITY): Payer: BC Managed Care – PPO | Admitting: Internal Medicine

## 2023-01-25 ENCOUNTER — Ambulatory Visit (HOSPITAL_COMMUNITY): Admission: RE | Admit: 2023-01-25 | Payer: BC Managed Care – PPO | Source: Ambulatory Visit

## 2023-02-12 ENCOUNTER — Encounter (HOSPITAL_COMMUNITY): Payer: Self-pay | Admitting: *Deleted

## 2023-02-12 NOTE — Progress Notes (Signed)
Pt last seen Feb 2022, was re-referred by [facility] and no-showed for echo and appt 01/25/23

## 2023-12-09 ENCOUNTER — Emergency Department (HOSPITAL_COMMUNITY)
Admission: EM | Admit: 2023-12-09 | Discharge: 2023-12-09 | Disposition: A | Discharge status: Home / Self Care | Attending: Emergency Medicine | Admitting: Emergency Medicine

## 2023-12-09 ENCOUNTER — Encounter (HOSPITAL_COMMUNITY): Payer: Self-pay | Admitting: Emergency Medicine

## 2023-12-09 ENCOUNTER — Emergency Department (HOSPITAL_COMMUNITY)

## 2023-12-09 ENCOUNTER — Other Ambulatory Visit: Payer: Self-pay

## 2023-12-09 DIAGNOSIS — L0291 Cutaneous abscess, unspecified: Secondary | ICD-10-CM

## 2023-12-09 DIAGNOSIS — L02214 Cutaneous abscess of groin: Secondary | ICD-10-CM | POA: Diagnosis present

## 2023-12-09 DIAGNOSIS — Z79899 Other long term (current) drug therapy: Secondary | ICD-10-CM | POA: Insufficient documentation

## 2023-12-09 DIAGNOSIS — I10 Essential (primary) hypertension: Secondary | ICD-10-CM | POA: Diagnosis not present

## 2023-12-09 LAB — CBC WITH DIFFERENTIAL/PLATELET
Abs Immature Granulocytes: 0.7 K/uL — ABNORMAL HIGH (ref 0.00–0.07)
Basophils Absolute: 0 K/uL (ref 0.0–0.1)
Basophils Relative: 0 %
Eosinophils Absolute: 0 K/uL (ref 0.0–0.5)
Eosinophils Relative: 0 %
HCT: 42.8 % (ref 39.0–52.0)
Hemoglobin: 15.3 g/dL (ref 13.0–17.0)
Lymphocytes Relative: 7 %
Lymphs Abs: 1.5 K/uL (ref 0.7–4.0)
MCH: 29.8 pg (ref 26.0–34.0)
MCHC: 35.7 g/dL (ref 30.0–36.0)
MCV: 83.4 fL (ref 80.0–100.0)
Metamyelocytes Relative: 2 %
Monocytes Absolute: 0.9 K/uL (ref 0.1–1.0)
Monocytes Relative: 4 %
Myelocytes: 1 %
Neutro Abs: 18.8 K/uL — ABNORMAL HIGH (ref 1.7–7.7)
Neutrophils Relative %: 86 %
Platelets: 178 K/uL (ref 150–400)
RBC: 5.13 MIL/uL (ref 4.22–5.81)
RDW: 13.9 % (ref 11.5–15.5)
WBC: 21.9 K/uL — ABNORMAL HIGH (ref 4.0–10.5)
nRBC: 0 % (ref 0.0–0.2)

## 2023-12-09 LAB — PROTIME-INR
INR: 1.2 (ref 0.8–1.2)
Prothrombin Time: 16 s — ABNORMAL HIGH (ref 11.4–15.2)

## 2023-12-09 LAB — LACTIC ACID, PLASMA: Lactic Acid, Venous: 1.1 mmol/L (ref 0.5–1.9)

## 2023-12-09 LAB — COMPREHENSIVE METABOLIC PANEL WITH GFR
ALT: 15 U/L (ref 0–44)
AST: 16 U/L (ref 15–41)
Albumin: 4 g/dL (ref 3.5–5.0)
Alkaline Phosphatase: 65 U/L (ref 38–126)
Anion gap: 13 (ref 5–15)
BUN: 12 mg/dL (ref 6–20)
CO2: 22 mmol/L (ref 22–32)
Calcium: 9 mg/dL (ref 8.9–10.3)
Chloride: 100 mmol/L (ref 98–111)
Creatinine, Ser: 1.14 mg/dL (ref 0.61–1.24)
GFR, Estimated: >60 mL/min (ref >60)
Glucose, Bld: 113 mg/dL — ABNORMAL HIGH (ref 70–99)
Potassium: 3.1 mmol/L — ABNORMAL LOW (ref 3.5–5.1)
Sodium: 135 mmol/L (ref 135–145)
Total Bilirubin: <0.2 mg/dL (ref 0.0–1.2)
Total Protein: 8.1 g/dL (ref 6.5–8.1)

## 2023-12-09 MED ORDER — DOXYCYCLINE HYCLATE 100 MG PO TABS
100.0000 mg | ORAL_TABLET | Freq: Once | ORAL | Status: AC
  Administered 2023-12-09: 100 mg via ORAL
  Filled 2023-12-09: qty 1

## 2023-12-09 MED ORDER — AMOXICILLIN-POT CLAVULANATE 875-125 MG PO TABS: 1.0000 | ORAL_TABLET | Freq: Two times a day (BID) | ORAL | 0 refills | Status: AC

## 2023-12-09 MED ORDER — LIDOCAINE-EPINEPHRINE (PF) 2 %-1:200000 IJ SOLN
10.0000 mL | Freq: Once | INTRAMUSCULAR | Status: AC
  Administered 2023-12-09: 10 mL via INTRADERMAL
  Filled 2023-12-09: qty 20

## 2023-12-09 MED ORDER — OXYCODONE HCL 5 MG PO TABS
5.0000 mg | ORAL_TABLET | ORAL | Status: AC
  Administered 2023-12-09: 5 mg via ORAL
  Filled 2023-12-09: qty 1

## 2023-12-09 MED ORDER — ACETAMINOPHEN 325 MG PO TABS
650.0000 mg | ORAL_TABLET | Freq: Once | ORAL | Status: AC
  Administered 2023-12-09: 650 mg via ORAL
  Filled 2023-12-09: qty 2

## 2023-12-09 MED ORDER — DOXYCYCLINE HYCLATE 100 MG PO CAPS: 100.0000 mg | ORAL_CAPSULE | Freq: Two times a day (BID) | ORAL | 0 refills | Status: AC

## 2023-12-09 MED ORDER — SODIUM CHLORIDE 0.9 % IV SOLN
1.0000 g | Freq: Once | INTRAVENOUS | Status: AC
  Administered 2023-12-09: 1 g via INTRAVENOUS
  Filled 2023-12-09: qty 10

## 2023-12-09 NOTE — ED Triage Notes (Signed)
 Pt presents with fever, chills, nausea and vomiting, since Friday, pt has several abscesses in perineal area, no drainage noted.

## 2023-12-09 NOTE — Discharge Instructions (Signed)
 You were seen for your abscess in the emergency department. It was drained and you have packing in.   At home, please use tylenol  and ibuprofen  for any pain or fevers.  Take the antibiotics for your infection. Do not submerge your drainage site or get it wet until the packing is removed.   Check your MyChart online for the results of any tests that had not resulted by the time you left the emergency department.   Follow-up with your primary doctor or an ED or urgent care in 2-3 days for a wound check and to have your packing removed.   Return immediately to the emergency department if you experience any of the following: fevers despite tylenol  and ibuprofen , severe pain, or any other concerning symptoms.    Thank you for visiting our Emergency Department. It was a pleasure taking care of you today.

## 2023-12-09 NOTE — ED Provider Notes (Signed)
 [place] EMERGENCY DEPARTMENT AT [facility] Provider Note   CSN: [id number] Arrival date & time: 12/09/23  1131     Patient presents with: Abscess   Antonio Morgan is a 43 y.o. male.   43 year old male with a history of hypertension and elevated BMI who presents to the emergency department abscess in his left groin.  Patient reports that on Friday he noticed that he was having some irritation from what he thought was his belt buckle in his left groin.  Says that the area started enlarging and becoming red.  Last night had some subjective fevers and did have a temperature of 101.76F.  Says he does not shave in that        Prior to Admission medications   Medication Sig Start Date End Date Taking? Authorizing Provider  amoxicillin -clavulanate (AUGMENTIN ) 875-125 MG tablet Take 1 tablet by mouth every 12 (twelve) hours. 12/09/23  Yes Yolande Lamar BROCKS, MD  doxycycline  (VIBRAMYCIN ) 100 MG capsule Take 1 capsule (100 mg total) by mouth 2 (two) times daily. 12/09/23  Yes Yolande Lamar BROCKS, MD  acetaminophen  (TYLENOL ) 325 MG tablet Take 650 mg by mouth every 6 (six) hours as needed for mild pain.     [provider]  carvedilol  (COREG ) 25 MG tablet TAKE (1) TABLET TWICE A DAY. 06/24/20   McLean, Dalton S, MD  colchicine  0.6 MG tablet Take 1 tablet daily as needed for gout. 11/30/20   Bensimhon, Toribio SAUNDERS, MD  FARXIGA  10 MG TABS tablet TAKE 1 TABLET DAILY BEFORE BREAKFAST. must have office visit FOR refills 02/12/22   Bensimhon, Toribio SAUNDERS, MD  furosemide  (LASIX ) 40 MG tablet TAKE 1 TABLET DAILY AS NEEDED 06/24/20   Rolan Ezra RAMAN, MD  hydrALAZINE  (APRESOLINE ) 50 MG tablet Take 75 mg by mouth 3 (three) times daily. 11/18/23   [provider]  ibuprofen  (ADVIL ,MOTRIN ) 600 MG tablet Take 1 tablet (600 mg total) by mouth every 6 (six) hours as needed. 05/29/17   Tran, Bowie, PA-C  isosorbide  mononitrate (IMDUR ) 60 MG 24 hr tablet TAKE 2 TABLETS DAILY 06/24/20   McLean,  Dalton S, MD  losartan  (COZAAR ) 100 MG tablet TAKE 1 TABLET ONCE DAILY IN THE EVENING 06/24/20   Rolan Ezra RAMAN, MD  sertraline  (ZOLOFT ) 50 MG tablet Take 0.5 tablets (25 mg total) by mouth daily for 14 days, THEN 1 tablet (50 mg total) daily. 05/30/20 07/13/20  Glena Harlene HERO, FNP  sildenafil  (VIAGRA ) 100 MG tablet TAKE 1 TABLET AS NEEDED FOR ERECTILE DYSFUNCTION. DO NOT TAKE ON DAYS YOU TAKE ISOSORBIDE  11/18/19   Bensimhon, Toribio SAUNDERS, MD    Allergies: Hydrochlorothiazide  and Lisinopril     Review of Systems  Updated Vital Signs BP 127/67 (BP Location: Right Arm)   Pulse 80   Temp 98.9 F (37.2 C) (Oral)   Resp 16   Ht 6' (1.829 m)   Wt 127 kg   SpO2 100%   BMI 37.97 kg/m   Physical Exam Constitutional:      General: He is not in acute distress.    Appearance: Normal appearance. He is not ill-appearing.  Abdominal:      Comments: No crepitance or subcutaneous emphysema.  No tenderness past the borders of the erythema.  Neurological:     Mental Status: He is alert.     (all labs ordered are listed, but only abnormal results are displayed) Labs Reviewed  COMPREHENSIVE METABOLIC PANEL WITH GFR - Abnormal; Notable for the following components:  Result Value   Potassium 3.1 (*)    Glucose, Bld 113 (*)    All other components within normal limits  CBC WITH DIFFERENTIAL/PLATELET - Abnormal; Notable for the following components:   WBC 21.9 (*)    Neutro Abs 18.8 (*)    Abs Immature Granulocytes 0.70 (*)    All other components within normal limits  PROTIME-INR - Abnormal; Notable for the following components:   Prothrombin Time 16.0 (*)    All other components within normal limits  CULTURE, BLOOD (ROUTINE X 2)  CULTURE, BLOOD (ROUTINE X 2)  LACTIC ACID, PLASMA  URINALYSIS, W/ REFLEX TO CULTURE (INFECTION SUSPECTED)    EKG: None  Radiology: DG Chest 2 View if patient is not in a treatment room. Result Date: 12/09/2023 CLINICAL DATA:  Suspected Sepsis EXAM: CHEST  - 2 VIEW COMPARISON:  December 11, 2015 FINDINGS: No focal airspace consolidation, pleural effusion, or pneumothorax. No cardiomegaly. No acute fracture or destructive lesions. Multilevel thoracic osteophytosis. IMPRESSION: No acute cardiopulmonary abnormality. Electronically Signed   By: Rogelia Myers M.D.   On: 12/09/2023 12:37     .Incision and Drainage  Date/Time: 12/09/2023 5:23 PM  Performed by: Yolande Lamar BROCKS, MD Authorized by: Yolande Lamar BROCKS, MD   Consent:    Consent obtained:  Verbal   Consent given by:  Patient   Risks discussed:  Bleeding, incomplete drainage, damage to other organs and infection Location:    Size:  3x4   Location:  Trunk   Trunk location:  Abdomen Pre-procedure details:    Skin preparation:  Chlorhexidine with alcohol Sedation:    Sedation type:  None Anesthesia:    Anesthesia method:  Local infiltration   Local anesthetic:  Lidocaine  1% WITH epi Procedure type:    Complexity:  Simple Procedure details:    Ultrasound guidance: yes     Incision types:  Single straight   Incision depth:  Subcutaneous   Wound management:  Probed and deloculated and irrigated with saline   Drainage:  Bloody and purulent   Drainage amount:  Copious   Wound treatment:  Wound left open and drain placed   Packing materials:  1/4 in iodoform gauze Post-procedure details:    Procedure completion:  Tolerated well, no immediate complications   EMERGENCY DEPARTMENT US  SOFT TISSUE INTERPRETATION Study: Limited Soft Tissue Ultrasound  INDICATIONS: Pain and Soft tissue infection Multiple views of the body part were obtained in real-time with a multi-frequency linear probe  PERFORMED BY: Myself IMAGES ARCHIVED?: No SIDE:Left BODY PART:Abdominal wall INTERPRETATION:  Abcess present, Cellulitis present, and no evidence of SQ air. Two abscesses approx 3x4x2 and 1x1x2.    Medications Ordered in the ED  acetaminophen  (TYLENOL ) tablet 650 mg (650 mg Oral Given  12/09/23 1213)  lidocaine -EPINEPHrine  (XYLOCAINE  W/EPI) 2 %-1:200000 (PF) injection 10 mL (10 mLs Intradermal Given 12/09/23 1415)  cefTRIAXone  (ROCEPHIN ) 1 g in sodium chloride  0.9 % 100 mL IVPB (0 g Intravenous Stopped 12/09/23 1537)  doxycycline  (VIBRA -TABS) tablet 100 mg (100 mg Oral Given 12/09/23 1437)  oxyCODONE  (Oxy IR/ROXICODONE ) immediate release tablet 5 mg (5 mg Oral Given 12/09/23 1437)                                    Medical Decision Making Amount and/or Complexity of Data Reviewed Labs: ordered. Radiology: ordered.  Risk OTC drugs. Prescription drug management.   43 year old male who presents emergency department abscesses in  his left groin  Initial Ddx:  Abscess, Fournier's, cellulitis, sepsis, furuncle  MDM/Course:  Patient presents emergency department for an abscess in his left groin.  Does have some surrounding cellulitis on exam as well.  Suspect that it was a furuncle.  He reported some fevers last night as well.  On arrival he is overall well-appearing.  He is afebrile currently.  Does have mild tachycardia but had improved without intervention prior to my evaluation.  Does have an abscess that was visualized on ultrasound which was incised and drained at the bedside.  He was given an IV dose of ceftriaxone  as well as doxycycline .  Sepsis protocol was initiated prior to my evaluation of the patient due to his elevated heart rate and fevers at home.  Upon re-evaluation vital signs have all normalized.  Does have a white count of 21 so did discuss admission for the patient and since he has been borderline septic the entire time but he is requesting to go home.  Feel this is reasonable since he is young and otherwise healthy and appears much better at this time.  Will have him follow-up in 2 to 3 days for wound check and to have the packing removed from his incision and drainage site.  This patient presents to the ED for concern of complaints listed in HPI, this involves  an extensive number of treatment options, and is a complaint that carries with it a high risk of complications and morbidity. Disposition including potential need for admission considered.   Dispo: DC Home. Return precautions discussed including, but not limited to, those listed in the AVS. Allowed pt time to ask questions which were answered fully prior to dc.  Additional history obtained from spouse Records reviewed Outpatient Clinic Notes The following labs were independently interpreted: CBC and show leukocytosis concerning for infection I personally reviewed and interpreted cardiac monitoring: normal sinus rhythm  I personally reviewed and interpreted the pt's EKG: see above for interpretation  I have reviewed the patients home medications and made adjustments as needed  Portions of this note were generated with Dragon dictation software. Dictation errors may occur despite best attempts at proofreading.     Final diagnoses:  Abscess    ED Discharge Orders          Ordered    amoxicillin -clavulanate (AUGMENTIN ) 875-125 MG tablet  Every 12 hours        12/09/23 1525    doxycycline  (VIBRAMYCIN ) 100 MG capsule  2 times daily        12/09/23 1525               Yolande Lamar BROCKS, MD 12/09/23 1727

## 2023-12-12 ENCOUNTER — Other Ambulatory Visit: Payer: Self-pay

## 2023-12-12 ENCOUNTER — Encounter (HOSPITAL_COMMUNITY): Payer: Self-pay | Admitting: Emergency Medicine

## 2023-12-12 ENCOUNTER — Emergency Department (HOSPITAL_COMMUNITY)
Admission: EM | Admit: 2023-12-12 | Discharge: 2023-12-12 | Disposition: A | Discharge status: Home / Self Care | Attending: Emergency Medicine | Admitting: Emergency Medicine

## 2023-12-12 DIAGNOSIS — Z5189 Encounter for other specified aftercare: Secondary | ICD-10-CM

## 2023-12-12 DIAGNOSIS — Z4801 Encounter for change or removal of surgical wound dressing: Secondary | ICD-10-CM | POA: Diagnosis present

## 2023-12-12 NOTE — Discharge Instructions (Signed)
 Please continue to take all antibiotics.  Please continue wound care as discussed at the bedside.  Follow-up closely with your primary care doctor on an outpatient basis.  Return to emergency department immediately for any new or worsening symptoms.

## 2023-12-12 NOTE — ED Triage Notes (Signed)
 Pt here to have wound packing removed from abscess drainage last week.  Feeling better. STill on anbx.

## 2023-12-12 NOTE — ED Provider Notes (Signed)
 [place] EMERGENCY DEPARTMENT AT [facility] Provider Note   CSN: [id number] Arrival date & time: 12/12/23  1040     Patient presents with: No chief complaint on file.   Antonio Morgan is a 43 y.o. male.   Patient is a 43 year old male who presents to the emergency department with a chief complaint of requiring a repeat wound check.  Patient was seen in the emergency department approximate 3 days ago for an abscess to the left groin.  This was incised and drained and he was placed on antibiotics at that time.  Packing was placed.  He did return to the emergency department today for repeat evaluation.  He does note that the area is feeling much better at this time with decreased drainage, swelling and pain.  He notes that he has had no associated fever or chills.        Prior to Admission medications   Medication Sig Start Date End Date Taking? Authorizing Provider  acetaminophen  (TYLENOL ) 325 MG tablet Take 650 mg by mouth every 6 (six) hours as needed for mild pain.     [provider]  amoxicillin -clavulanate (AUGMENTIN ) 875-125 MG tablet Take 1 tablet by mouth every 12 (twelve) hours. 12/09/23   Yolande Lamar BROCKS, MD  carvedilol  (COREG ) 25 MG tablet TAKE (1) TABLET TWICE A DAY. 06/24/20   McLean, Dalton S, MD  colchicine  0.6 MG tablet Take 1 tablet daily as needed for gout. 11/30/20   Bensimhon, Toribio SAUNDERS, MD  doxycycline  (VIBRAMYCIN ) 100 MG capsule Take 1 capsule (100 mg total) by mouth 2 (two) times daily. 12/09/23   Yolande Lamar BROCKS, MD  FARXIGA  10 MG TABS tablet TAKE 1 TABLET DAILY BEFORE BREAKFAST. must have office visit FOR refills 02/12/22   Bensimhon, Toribio SAUNDERS, MD  furosemide  (LASIX ) 40 MG tablet TAKE 1 TABLET DAILY AS NEEDED 06/24/20   Rolan Ezra RAMAN, MD  hydrALAZINE  (APRESOLINE ) 50 MG tablet Take 75 mg by mouth 3 (three) times daily. 11/18/23   [provider]  ibuprofen  (ADVIL ,MOTRIN ) 600 MG tablet Take 1 tablet (600 mg total) by mouth every 6  (six) hours as needed. 05/29/17   Tran, Bowie, PA-C  isosorbide  mononitrate (IMDUR ) 60 MG 24 hr tablet TAKE 2 TABLETS DAILY 06/24/20   McLean, Dalton S, MD  losartan  (COZAAR ) 100 MG tablet TAKE 1 TABLET ONCE DAILY IN THE EVENING 06/24/20   Rolan Ezra RAMAN, MD  sertraline  (ZOLOFT ) 50 MG tablet Take 0.5 tablets (25 mg total) by mouth daily for 14 days, THEN 1 tablet (50 mg total) daily. 05/30/20 07/13/20  Glena Harlene HERO, FNP  sildenafil  (VIAGRA ) 100 MG tablet TAKE 1 TABLET AS NEEDED FOR ERECTILE DYSFUNCTION. DO NOT TAKE ON DAYS YOU TAKE ISOSORBIDE  11/18/19   Bensimhon, Toribio SAUNDERS, MD    Allergies: Hydrochlorothiazide  and Lisinopril     Review of Systems  Skin:        Abscess recheck  All other systems reviewed and are negative.   Updated Vital Signs There were no vitals taken for this visit.  Physical Exam Vitals and nursing note reviewed.  Constitutional:      Appearance: Normal appearance.  HENT:     Head: Normocephalic and atraumatic.  Eyes:     Extraocular Movements: Extraocular movements intact.     Conjunctiva/sclera: Conjunctivae normal.     Pupils: Pupils are equal, round, and reactive to light.  Cardiovascular:     Rate and Rhythm: Normal rate and regular rhythm.     Pulses:  Normal pulses.     Heart sounds: Normal heart sounds.  Pulmonary:     Effort: Pulmonary effort is normal. No respiratory distress.  Abdominal:     General: Abdomen is flat. Bowel sounds are normal. There is no distension.     Palpations: Abdomen is soft.     Tenderness: There is no abdominal tenderness. There is no guarding.  Musculoskeletal:        General: Normal range of motion.  Skin:    General: Skin is warm and dry.     Comments: Abscess site to the left groin with decreased induration, discharge, packing in place, no lymphatic streaking  Neurological:     General: No focal deficit present.     Mental Status: He is alert and oriented to person, place, and time. Mental status is at baseline.      (all labs ordered are listed, but only abnormal results are displayed) Labs Reviewed - No data to display  EKG: None  Radiology: No results found.   Procedures   Medications Ordered in the ED - No data to display                                  Medical Decision Making Patient is doing well at this time and is stable for discharge home.  Packing was removed.  Area is well-healing at this point.  There is no indication for spread to the peritoneum, penis or testicles.  He has no indication for Fournier's gangrene at this point.  Educated on the importance of taking all antibiotics as directed.  Discussed the need for close follow-up with his primary care doctor.  Strict return precautions were discussed for any new or worsening symptoms.        Final diagnoses:  None    ED Discharge Orders     None          Daralene Lonni JONETTA DEVONNA 12/12/23 1114    Dean Clarity, MD 12/12/23 1504

## 2023-12-15 LAB — CULTURE, BLOOD (ROUTINE X 2)
Culture: NO GROWTH
Culture: NO GROWTH
Special Requests: ADEQUATE
Special Requests: ADEQUATE

## 2024-03-02 ENCOUNTER — Encounter (HOSPITAL_COMMUNITY): Payer: Self-pay

## 2024-03-02 ENCOUNTER — Emergency Department (HOSPITAL_COMMUNITY)
Admission: EM | Admit: 2024-03-02 | Discharge: 2024-03-02 | Disposition: A | Discharge status: Home / Self Care | Attending: Emergency Medicine | Admitting: Emergency Medicine

## 2024-03-02 ENCOUNTER — Emergency Department (HOSPITAL_COMMUNITY)

## 2024-03-02 ENCOUNTER — Other Ambulatory Visit: Payer: Self-pay

## 2024-03-02 DIAGNOSIS — Z79899 Other long term (current) drug therapy: Secondary | ICD-10-CM | POA: Insufficient documentation

## 2024-03-02 DIAGNOSIS — K529 Noninfective gastroenteritis and colitis, unspecified: Secondary | ICD-10-CM | POA: Diagnosis not present

## 2024-03-02 DIAGNOSIS — R109 Unspecified abdominal pain: Secondary | ICD-10-CM | POA: Diagnosis present

## 2024-03-02 DIAGNOSIS — A0472 Enterocolitis due to Clostridium difficile, not specified as recurrent: Secondary | ICD-10-CM

## 2024-03-02 LAB — COMPREHENSIVE METABOLIC PANEL WITH GFR
ALT: 10 U/L (ref 0–44)
AST: 18 U/L (ref 15–41)
Albumin: 4.5 g/dL (ref 3.5–5.0)
Alkaline Phosphatase: 61 U/L (ref 38–126)
Anion gap: 12 (ref 5–15)
BUN: 12 mg/dL (ref 6–20)
CO2: 28 mmol/L (ref 22–32)
Calcium: 9.2 mg/dL (ref 8.9–10.3)
Chloride: 101 mmol/L (ref 98–111)
Creatinine, Ser: 0.99 mg/dL (ref 0.61–1.24)
GFR, Estimated: >60 mL/min (ref >60)
Glucose, Bld: 146 mg/dL — ABNORMAL HIGH (ref 70–99)
Potassium: 3.3 mmol/L — ABNORMAL LOW (ref 3.5–5.1)
Sodium: 142 mmol/L (ref 135–145)
Total Bilirubin: 0.6 mg/dL (ref 0.0–1.2)
Total Protein: 7.3 g/dL (ref 6.5–8.1)

## 2024-03-02 LAB — CBC WITH DIFFERENTIAL/PLATELET
Abs Immature Granulocytes: 0.1 K/uL — ABNORMAL HIGH (ref 0.00–0.07)
Basophils Absolute: 0 K/uL (ref 0.0–0.1)
Basophils Relative: 0 %
Eosinophils Absolute: 0.2 K/uL (ref 0.0–0.5)
Eosinophils Relative: 1 %
HCT: 45.4 % (ref 39.0–52.0)
Hemoglobin: 16.3 g/dL (ref 13.0–17.0)
Immature Granulocytes: 1 %
Lymphocytes Relative: 10 %
Lymphs Abs: 2.1 K/uL (ref 0.7–4.0)
MCH: 30 pg (ref 26.0–34.0)
MCHC: 35.9 g/dL (ref 30.0–36.0)
MCV: 83.6 fL (ref 80.0–100.0)
Monocytes Absolute: 1.5 K/uL — ABNORMAL HIGH (ref 0.1–1.0)
Monocytes Relative: 7 %
Neutro Abs: 17.8 K/uL — ABNORMAL HIGH (ref 1.7–7.7)
Neutrophils Relative %: 81 %
Platelets: 269 K/uL (ref 150–400)
RBC: 5.43 MIL/uL (ref 4.22–5.81)
RDW: 14.3 % (ref 11.5–15.5)
Smear Review: NORMAL
WBC: 21.6 K/uL — ABNORMAL HIGH (ref 4.0–10.5)
nRBC: 0 % (ref 0.0–0.2)

## 2024-03-02 LAB — GASTROINTESTINAL PANEL BY PCR, STOOL (REPLACES STOOL CULTURE)

## 2024-03-02 LAB — C DIFFICILE QUICK SCREEN W PCR REFLEX
C Diff antigen: POSITIVE — AB
C Diff interpretation: DETECTED
C Diff toxin: POSITIVE — AB

## 2024-03-02 MED ORDER — IBUPROFEN 800 MG PO TABS: 800.0000 mg | ORAL_TABLET | Freq: Three times a day (TID) | ORAL | 0 refills | Status: AC | PRN

## 2024-03-02 MED ORDER — METRONIDAZOLE 500 MG PO TABS: 500.0000 mg | ORAL_TABLET | Freq: Three times a day (TID) | ORAL | 0 refills | Status: DC

## 2024-03-02 MED ORDER — CIPROFLOXACIN HCL 500 MG PO TABS: 500.0000 mg | ORAL_TABLET | Freq: Two times a day (BID) | ORAL | 0 refills | Status: DC

## 2024-03-02 MED ORDER — IOHEXOL 350 MG/ML SOLN
80.0000 mL | Freq: Once | INTRAVENOUS | Status: AC | PRN
  Administered 2024-03-02: 80 mL via INTRAVENOUS

## 2024-03-02 MED ORDER — FIDAXOMICIN 200 MG PO TABS: 200.0000 mg | ORAL_TABLET | Freq: Two times a day (BID) | ORAL | 0 refills | Status: AC

## 2024-03-02 MED ORDER — KETOROLAC TROMETHAMINE 30 MG/ML IJ SOLN
30.0000 mg | Freq: Once | INTRAMUSCULAR | Status: AC
  Administered 2024-03-02: 30 mg via INTRAVENOUS
  Filled 2024-03-02: qty 1

## 2024-03-02 MED ORDER — SODIUM CHLORIDE 0.9 % IV BOLUS
1000.0000 mL | Freq: Once | INTRAVENOUS | Status: AC
  Administered 2024-03-02: 1000 mL via INTRAVENOUS

## 2024-03-02 MED ORDER — ONDANSETRON 4 MG PO TBDP
ORAL_TABLET | ORAL | 0 refills | Status: AC
Start: 2024-03-02 — End: ?

## 2024-03-02 NOTE — ED Triage Notes (Signed)
 Patient arrives POV c/c vomiting/diarrhea x1 week. Saw provider recently but labs were unremarkable. Endorses uncomfortable cramps that start immediately before diarrhea or vomiting begins.

## 2024-03-02 NOTE — Discharge Instructions (Addendum)
 Follow-up with Dr. Cindie or one of his associates within a week.  If you get worse just return to the emergency department and we will arrange for your admission to the hospital

## 2024-03-02 NOTE — ED Provider Notes (Signed)
 [place] EMERGENCY DEPARTMENT AT [facility] Provider Note   CSN: [id number] Arrival date & time: 03/02/24  1005     Patient presents with: Illness   Antonio Morgan is a 43 y.o. male.  {Add pertinent medical, surgical, social history, OB history to YEP:67052} Patient states she has had vomiting and diarrhea for a week but mostly diarrhea.  He also has been having some abdominal cramps   Illness      Prior to Admission medications   Medication Sig Start Date End Date Taking? Authorizing Provider  carvedilol  (COREG ) 25 MG tablet TAKE (1) TABLET TWICE A DAY. 06/24/20  Yes McLean, Dalton S, MD  ciprofloxacin (CIPRO) 500 MG tablet Take 1 tablet (500 mg total) by mouth 2 (two) times daily. One po bid x 7 days 03/02/24  Yes Jerric Oyen, MD  furosemide  (LASIX ) 40 MG tablet TAKE 1 TABLET DAILY AS NEEDED 06/24/20  Yes McLean, Dalton S, MD  hydrALAZINE  (APRESOLINE ) 50 MG tablet Take 75 mg by mouth 3 (three) times daily. 11/18/23  Yes [provider]  ibuprofen  (ADVIL ) 800 MG tablet Take 1 tablet (800 mg total) by mouth every 8 (eight) hours as needed. 03/02/24  Yes Glorious Flicker, MD  isosorbide  mononitrate (IMDUR ) 60 MG 24 hr tablet TAKE 2 TABLETS DAILY 06/24/20  Yes McLean, Dalton S, MD  losartan  (COZAAR ) 100 MG tablet TAKE 1 TABLET ONCE DAILY IN THE EVENING 06/24/20  Yes McLean, Dalton S, MD  metroNIDAZOLE (FLAGYL) 500 MG tablet Take 1 tablet (500 mg total) by mouth 3 (three) times daily. 03/02/24  Yes Suzette Pac, MD  acetaminophen  (TYLENOL ) 325 MG tablet Take 650 mg by mouth every 6 (six) hours as needed for mild pain.     [provider]  amoxicillin -clavulanate (AUGMENTIN ) 875-125 MG tablet Take 1 tablet by mouth every 12 (twelve) hours. 12/09/23   Yolande Lamar BROCKS, MD  colchicine  0.6 MG tablet Take 1 tablet daily as needed for gout. 11/30/20   Bensimhon, Toribio SAUNDERS, MD  doxycycline  (VIBRAMYCIN ) 100 MG capsule Take 1 capsule (100 mg total) by mouth 2 (two)  times daily. 12/09/23   Yolande Lamar BROCKS, MD  FARXIGA  10 MG TABS tablet TAKE 1 TABLET DAILY BEFORE BREAKFAST. must have office visit FOR refills 02/12/22   Bensimhon, Toribio SAUNDERS, MD  sertraline  (ZOLOFT ) 50 MG tablet Take 0.5 tablets (25 mg total) by mouth daily for 14 days, THEN 1 tablet (50 mg total) daily. 05/30/20 07/13/20  Glena Harlene HERO, FNP  sildenafil  (VIAGRA ) 100 MG tablet TAKE 1 TABLET AS NEEDED FOR ERECTILE DYSFUNCTION. DO NOT TAKE ON DAYS YOU TAKE ISOSORBIDE  11/18/19   Bensimhon, Toribio SAUNDERS, MD    Allergies: Hydrochlorothiazide  and Lisinopril     Review of Systems  Updated Vital Signs BP 117/70   Pulse 78   Temp 98.4 F (36.9 C) (Oral)   Ht 6' 1 (1.854 m)   Wt 122.5 kg   SpO2 93%   BMI 35.62 kg/m   Physical Exam  (all labs ordered are listed, but only abnormal results are displayed) Labs Reviewed  CBC WITH DIFFERENTIAL/PLATELET - Abnormal; Notable for the following components:      Result Value   WBC 21.6 (*)    Neutro Abs 17.8 (*)    Monocytes Absolute 1.5 (*)    Abs Immature Granulocytes 0.10 (*)    All other components within normal limits  COMPREHENSIVE METABOLIC PANEL WITH GFR - Abnormal; Notable for the following components:   Potassium 3.3 (*)  Glucose, Bld 146 (*)    All other components within normal limits  GASTROINTESTINAL PANEL BY PCR, STOOL (REPLACES STOOL CULTURE)  C DIFFICILE QUICK SCREEN W PCR REFLEX      EKG: None  Radiology: CT ABDOMEN PELVIS W CONTRAST Result Date: 03/02/2024 CLINICAL DATA:  Abdominal pain. EXAM: CT ABDOMEN AND PELVIS WITH CONTRAST TECHNIQUE: Multidetector CT imaging of the abdomen and pelvis was performed using the standard protocol following bolus administration of intravenous contrast. RADIATION DOSE REDUCTION: This exam was performed according to the departmental dose-optimization program which includes automated exposure control, adjustment of the mA and/or kV according to patient size and/or use of iterative  reconstruction technique. CONTRAST:  80mL OMNIPAQUE IOHEXOL 350 MG/ML SOLN COMPARISON:  None Available. FINDINGS: Lower chest: The visualized lung bases are clear. No intra-abdominal free air or free fluid. Hepatobiliary: Slightly lobulated appearing liver contour suspicious for early changes of cirrhosis. Clinical correlation recommended. No biliary dilatation. The gallbladder is unremarkable. Pancreas: Unremarkable. No pancreatic ductal dilatation or surrounding inflammatory changes. Spleen: Normal in size without focal abnormality. Adrenals/Urinary Tract: The adrenal glands unremarkable. There is no hydronephrosis on either side. The visualized ureters and urinary bladder appear unremarkable. Stomach/Bowel: There is diffuse thickened and inflamed appearance of the colon most consistent with colitis. Underlying infiltrative mass is not excluded. Clinical correlation and follow-up recommended. There is no bowel obstruction. The appendix is normal. Vascular/Lymphatic: The abdominal aorta and IVC are unremarkable. No portal venous gas. There is no adenopathy. Reproductive: The prostate and seminal vesicles are grossly remarkable. Other: None Musculoskeletal: No acute osseous pathology. IMPRESSION: 1. Pancolitis. No bowel obstruction. Normal appendix. 2. Slightly lobulated appearing liver contour suspicious for early changes of cirrhosis. Electronically Signed   By: Vanetta Chou M.D.   On: 03/02/2024 13:16    {Document cardiac monitor, telemetry assessment procedure when appropriate:32947} Procedures   Medications Ordered in the ED  sodium chloride  0.9 % bolus 1,000 mL (1,000 mLs Intravenous New Bag/Given 03/02/24 1049)  ketorolac (TORADOL) 30 MG/ML injection 30 mg (30 mg Intravenous Given 03/02/24 1049)  iohexol (OMNIPAQUE) 350 MG/ML injection 80 mL (80 mLs Intravenous Contrast Given 03/02/24 1233)   I spoke with Dr. Cindie of GI and since the patient does not want to be admitted right now he agrees  with treatment with Cipro and Flagyl as an outpatient and follow-up with GI.  Patient will return if he gets worse   {Click here for ABCD2, HEART and other calculators REFRESH Note before signing:1}                              Medical Decision Making Amount and/or Complexity of Data Reviewed Labs: ordered. Radiology: ordered.  Risk Prescription drug management.   Pancolitis  {Document critical care time when appropriate  Document review of labs and clinical decision tools ie CHADS2VASC2, etc  Document your independent review of radiology images and any outside records  Document your discussion with family members, caretakers and with consultants  Document social determinants of health affecting pt's care  Document your decision making why or why not admission, treatments were needed:32947:::1}   Final diagnoses:  Pancolitis    ED Discharge Orders          Ordered    metroNIDAZOLE (FLAGYL) 500 MG tablet  3 times daily        03/02/24 1432    ciprofloxacin (CIPRO) 500 MG tablet  2 times daily  03/02/24 1432    ibuprofen  (ADVIL ) 800 MG tablet  Every 8 hours PRN        03/02/24 1432

## 2024-03-03 ENCOUNTER — Telehealth: Payer: Self-pay | Admitting: Gastroenterology

## 2024-03-03 NOTE — Telephone Encounter (Signed)
 I already had a conversation with this pt before lunch , into my lunch break. He does have his medication and he was advised that he would need an appt to be seen.

## 2024-03-03 NOTE — Telephone Encounter (Signed)
 Antonio Morgan:  Patient was in the ED on 11/10 and discharged. GI did not see Antonio Morgan during this time.  Possible cirrhosis on CT. Colitis on CT with positive Cdiff.   Please put in a new patient slot with any APP or Dr. Cindie. Ideally within the next 2-3 weeks at most.   Dena or any CMA: can we make sure patient was able to pick up Dificid from pharmacy? If not, we will need to do vanc.

## 2024-03-06 ENCOUNTER — Telehealth: Payer: Self-pay | Admitting: Internal Medicine

## 2024-03-06 NOTE — Telephone Encounter (Signed)
 Called to schedule patient appointment for a hospital follow up. Left voicemail.

## 2024-03-10 NOTE — Telephone Encounter (Signed)
 Thanks, please arrange appt with any APP or Dr Cindie. Needs to be in next 2-3 weeks. He has not been seen before. Needs to be in a new patient slot.

## 2024-03-11 ENCOUNTER — Telehealth: Payer: Self-pay | Admitting: Gastroenterology

## 2024-03-11 NOTE — Telephone Encounter (Signed)
 Called patient to schedule hospital follow up. No answer. Left voicemail.
